# Patient Record
Sex: Female | Born: 1985 | Race: White | Hispanic: No | Marital: Married | Smoking: Former smoker
Health system: Southern US, Community
[De-identification: ages and names within clinical notes are randomized; demographics above are authoritative.]

## PROBLEM LIST (undated history)

## (undated) ENCOUNTER — Inpatient Hospital Stay (HOSPITAL_COMMUNITY): Payer: Self-pay

## (undated) DIAGNOSIS — K219 Gastro-esophageal reflux disease without esophagitis: Secondary | ICD-10-CM

## (undated) DIAGNOSIS — M542 Cervicalgia: Secondary | ICD-10-CM

## (undated) DIAGNOSIS — Z8659 Personal history of other mental and behavioral disorders: Secondary | ICD-10-CM

## (undated) DIAGNOSIS — G43909 Migraine, unspecified, not intractable, without status migrainosus: Secondary | ICD-10-CM

## (undated) DIAGNOSIS — F410 Panic disorder [episodic paroxysmal anxiety] without agoraphobia: Secondary | ICD-10-CM

## (undated) DIAGNOSIS — R220 Localized swelling, mass and lump, head: Secondary | ICD-10-CM

## (undated) DIAGNOSIS — R002 Palpitations: Secondary | ICD-10-CM

## (undated) HISTORY — DX: Palpitations: R00.2

## (undated) HISTORY — DX: Panic disorder (episodic paroxysmal anxiety): F41.0

## (undated) HISTORY — DX: Personal history of other mental and behavioral disorders: Z86.59

## (undated) HISTORY — DX: Cervicalgia: M54.2

## (undated) HISTORY — PX: MOUTH SURGERY: SHX715

## (undated) HISTORY — DX: Localized swelling, mass and lump, head: R22.0

---
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
## 2011-05-11 DIAGNOSIS — G43909 Migraine, unspecified, not intractable, without status migrainosus: Secondary | ICD-10-CM | POA: Insufficient documentation

## 2011-08-11 ENCOUNTER — Other Ambulatory Visit: Payer: Self-pay

## 2011-08-11 ENCOUNTER — Emergency Department (HOSPITAL_COMMUNITY)
Admission: EM | Admit: 2011-08-11 | Discharge: 2011-08-12 | Disposition: A | Discharge status: Home / Self Care | Payer: 59 | Attending: Emergency Medicine | Admitting: Emergency Medicine

## 2011-08-11 ENCOUNTER — Encounter (HOSPITAL_COMMUNITY): Payer: Self-pay | Admitting: *Deleted

## 2011-08-11 ENCOUNTER — Emergency Department (HOSPITAL_COMMUNITY): Payer: 59

## 2011-08-11 DIAGNOSIS — R209 Unspecified disturbances of skin sensation: Secondary | ICD-10-CM | POA: Insufficient documentation

## 2011-08-11 DIAGNOSIS — R221 Localized swelling, mass and lump, neck: Secondary | ICD-10-CM | POA: Insufficient documentation

## 2011-08-11 DIAGNOSIS — R51 Headache: Secondary | ICD-10-CM | POA: Insufficient documentation

## 2011-08-11 DIAGNOSIS — R220 Localized swelling, mass and lump, head: Secondary | ICD-10-CM | POA: Insufficient documentation

## 2011-08-11 DIAGNOSIS — R002 Palpitations: Secondary | ICD-10-CM | POA: Insufficient documentation

## 2011-08-11 DIAGNOSIS — R000 Tachycardia, unspecified: Secondary | ICD-10-CM | POA: Insufficient documentation

## 2011-08-11 MED ORDER — FAMOTIDINE IN NACL 20-0.9 MG/50ML-% IV SOLN
20.0000 mg | Freq: Once | INTRAVENOUS | Status: AC
  Administered 2011-08-11: 20 mg via INTRAVENOUS
  Filled 2011-08-11: qty 50

## 2011-08-11 MED ORDER — SODIUM CHLORIDE 0.9 % IV BOLUS (SEPSIS)
1000.0000 mL | Freq: Once | INTRAVENOUS | Status: AC
  Administered 2011-08-11: 1000 mL via INTRAVENOUS

## 2011-08-11 MED ORDER — DIPHENHYDRAMINE HCL 50 MG/ML IJ SOLN
25.0000 mg | Freq: Once | INTRAMUSCULAR | Status: AC
Start: 2011-08-11 — End: 2011-08-11
  Administered 2011-08-11: 25 mg via INTRAVENOUS
  Filled 2011-08-11: qty 1

## 2011-08-11 MED ORDER — LORAZEPAM 2 MG/ML IJ SOLN
1.0000 mg | Freq: Once | INTRAMUSCULAR | Status: AC
  Administered 2011-08-11: 1 mg via INTRAVENOUS
  Filled 2011-08-11: qty 1

## 2011-08-11 NOTE — ED Notes (Signed)
Pt in c/o swelling to left side of face and neck and pain, denies injury, noted this morning when she woke up, also c/o palpitations and feeling like her heart is racing, states this has happened in the past

## 2011-08-11 NOTE — ED Provider Notes (Signed)
History     CSN: [id number]  Arrival date & time 08/11/11  1859   First MD Initiated Contact with Patient 08/11/11 2008      Chief Complaint  Patient presents with  . Facial Swelling  . 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  No history of cardiac or stroke in the past - states is under a lot of pressure as she is stressed at work and is getting married in two months.  Patient is a 26 y.o. female presenting with palpitations. The history is provided by the patient and the spouse. No language interpreter was used.  Palpitations  This is a new problem. The current episode started 3 to 5 hours ago. The problem occurs constantly. The problem has not changed since onset.The problem is associated with stress. On average, each episode lasts 2 hours. Associated symptoms include numbness and headaches. Pertinent negatives include no diaphoresis, no fever, no malaise/fatigue, no chest pain, no chest pressure, no claudication, no exertional chest pressure, no irregular heartbeat, no near-syncope, no orthopnea, no PND, no syncope, no abdominal pain, no nausea, no vomiting, no back pain, no leg pain, no lower extremity edema, no dizziness, no weakness, no cough, no hemoptysis, no shortness of breath and no sputum production. She has tried nothing for the symptoms. The treatment provided no relief. Risk factors include stress.    History reviewed. No pertinent past medical history.  History reviewed. No  pertinent past surgical history.  History reviewed. No pertinent family history.  History  Substance Use Topics  . Smoking status: Not on file  . Smokeless tobacco: Not on file  . Alcohol Use: Not on file    OB History    Grav Para Term Preterm Abortions TAB SAB Ect Mult Living                  Review of Systems  Constitutional: Negative for fever, malaise/fatigue and diaphoresis.  Respiratory: Negative for cough, hemoptysis, sputum production and shortness of breath.   Cardiovascular: Positive for palpitations. Negative for chest pain, orthopnea, claudication, syncope, PND and near-syncope.  Gastrointestinal: Negative for nausea, vomiting and abdominal pain.  Musculoskeletal: Negative for back pain.  Neurological: Positive for numbness and headaches. Negative for dizziness and weakness.  All other systems reviewed and are negative.    Allergies  Codeine; Prednisone; and Amoxicillin  Home Medications   Current Outpatient Rx  Name Route Sig Dispense Refill  . DEXTROAMPHETAMINE SULFATE ER 15 MG PO CP24 Oral Take 15 mg by mouth daily.    . SUMATRIPTAN SUCCINATE 25 MG PO TABS Oral Take 25 mg by mouth every 2 (two) hours as needed. For symptom relief      BP 121/81  Pulse 84  Temp(Src) 98.2 F (36.8 C) (Oral)  Resp 20  Ht 5\' 2"  (1.575 m)  Wt 138 lb (62.596 kg)  BMI 25.24 kg/m2  SpO2 100%  LMP 07/22/2011  Physical Exam  Nursing note and vitals reviewed. Constitutional: She is oriented to  person, place, and time. She appears well-developed and well-nourished. No distress.  HENT:  Head: Normocephalic and atraumatic.  Right Ear: External ear normal.  Left Ear: External ear normal.  Nose: Nose normal.  Mouth/Throat: Oropharynx is clear and moist. No oropharyngeal exudate.       No oropharyngeal edema noted.  Eyes: Conjunctivae are normal. Pupils are equal, round, and reactive to light. No scleral icterus.  Neck: Normal range of motion. Neck supple.  Cardiovascular:  Regular rhythm and normal heart sounds.        tachycardia  Pulmonary/Chest: Effort normal and breath sounds normal. No respiratory distress. She exhibits no tenderness.  Abdominal: Soft. Bowel sounds are normal. She exhibits no distension. There is no tenderness.  Musculoskeletal: Normal range of motion. She exhibits no edema and no tenderness.  Lymphadenopathy:    She has no cervical adenopathy.  Neurological: She is alert and oriented to person, place, and time. No cranial nerve deficit.  Skin: Skin is warm and dry. No rash noted. No erythema. No pallor.  Psychiatric: She has a normal mood and affect. Her behavior is normal. Judgment and thought content normal.    ED Course  Procedures (including critical care time)  Labs Reviewed - No data to display Ct Head Wo Contrast  08/11/2011  *RADIOLOGY REPORT*  Clinical Data: Left face swelling and numbness.  CT HEAD WITHOUT CONTRAST  Technique:  Contiguous axial images were obtained from the base of the skull through the vertex without contrast.  Comparison: None.  Findings: No evidence for acute hemorrhage, mass lesion, midline shift, hydrocephalus or large infarct.  Normal appearance of the ventricles and basal cisterns.  The white matter in the left cerebral hemisphere is slightly prominent but probably within normal limits.  No acute bony abnormality.  IMPRESSION: Negative head CT.  Original Report Authenticated By: Richarda Overlie, M.D.    Date: 08/11/2011  Rate: 135  Rhythm: sinus tachycardia  QRS Axis: normal  Intervals: normal  ST/T Wave abnormalities: nonspecific T wave changes  Conduction Disutrbances:none  Narrative Interpretation: Reviewed by Dr. Weldon Inches  Old EKG Reviewed: none available   Date: 08/12/2011  Rate: 83  Rhythm: normal sinus rhythm  QRS Axis: normal  Intervals: normal  ST/T Wave abnormalities: normal  Conduction Disutrbances:none  Narrative Interpretation:   Old EKG Reviewed: changes  noted    Palpitations     MDM  Patient's symptoms have improved with ativan and pepcid and benadryl - still has slight headache - will repeat the EKG now that her HR is down to 86.   Patient with palpatiations - likely secondary to dexedrine use - though cannot rule out a cardiac etiology - will refer to outpatient cardiology for this - will also do ativan for the symptoms as well.       Izola Price [place], [place] 08/12/11 [phone]

## 2011-08-12 MED ORDER — LORAZEPAM 1 MG PO TABS: 1.0000 mg | ORAL_TABLET | Freq: Three times a day (TID) | ORAL | Status: AC | PRN

## 2011-08-12 NOTE — ED Provider Notes (Signed)
Medical screening examination/treatment/procedure(s) were performed by non-physician practitioner and as supervising physician I was immediately available for consultation/collaboration.   Glynn Octave, MD 08/12/11 [phone]

## 2011-08-12 NOTE — Discharge Instructions (Signed)
Palpitations  A palpitation is the feeling that your heartbeat is irregular. It may also feel like your heart is beating faster than normal.  HOME CARE To help prevent palpitations:  Do not drink caffeine or alcohol.   Do not eat chocolate.   Do not smoke.   Reduce stress. Try yoga or meditation.   Swim, jogg, or walk.  GET HELP RIGHT AWAY IF:   There is chest pain.   You have shortness of breath.   You deveop a very bad headache.   You develop dizziness or fainting.   You continue to have a fast heartbeat.   The palpitations occur more often.  MAKE SURE YOU:   Understand these instructions.   Will watch your condition.   Will get help right away if you are not doing well or get worse.  Document Released: 03/20/2008 Document Revised: 02/21/2011 Document Reviewed: 03/20/2008 [facility] Patient Information 2012 [place], [place].

## 2011-08-24 ENCOUNTER — Institutional Professional Consult (permissible substitution): Payer: 59 | Admitting: Internal Medicine

## 2011-09-11 ENCOUNTER — Encounter: Payer: Self-pay | Admitting: *Deleted

## 2011-09-11 ENCOUNTER — Ambulatory Visit: Payer: 59 | Admitting: Cardiovascular Disease

## 2011-09-12 ENCOUNTER — Institutional Professional Consult (permissible substitution): Payer: 59 | Admitting: Cardiovascular Disease

## 2012-02-29 ENCOUNTER — Encounter: Payer: Self-pay | Admitting: *Deleted

## 2012-02-29 ENCOUNTER — Encounter: Payer: Self-pay | Admitting: Cardiovascular Disease

## 2012-02-29 DIAGNOSIS — F410 Panic disorder [episodic paroxysmal anxiety] without agoraphobia: Secondary | ICD-10-CM | POA: Insufficient documentation

## 2012-02-29 DIAGNOSIS — M542 Cervicalgia: Secondary | ICD-10-CM | POA: Insufficient documentation

## 2012-02-29 DIAGNOSIS — R220 Localized swelling, mass and lump, head: Secondary | ICD-10-CM | POA: Insufficient documentation

## 2012-02-29 DIAGNOSIS — R002 Palpitations: Secondary | ICD-10-CM | POA: Insufficient documentation

## 2012-03-03 ENCOUNTER — Encounter: Payer: Self-pay | Admitting: Cardiovascular Disease

## 2012-03-03 ENCOUNTER — Ambulatory Visit (INDEPENDENT_AMBULATORY_CARE_PROVIDER_SITE_OTHER): Payer: 59 | Admitting: Cardiovascular Disease

## 2012-03-03 VITALS — BP 112/70 | HR 72 | Ht 65.0 in | Wt 146.0 lb

## 2012-03-03 DIAGNOSIS — F410 Panic disorder [episodic paroxysmal anxiety] without agoraphobia: Secondary | ICD-10-CM

## 2012-03-03 DIAGNOSIS — R079 Chest pain, unspecified: Secondary | ICD-10-CM

## 2012-03-03 DIAGNOSIS — R002 Palpitations: Secondary | ICD-10-CM

## 2012-03-03 NOTE — Progress Notes (Signed)
Patient ID: Darlene Henderson, female   DOB: [birth year], 26 y.o.   MRN: [id number] 26 you referred by ER  Seen 2/13  Palpitations.  Was on dexidrine at time for ADD.  Has had this for a long time on concerta and ritalin in past.  Palpitations sound benign Not related to exertion.  Last minutes.  No associated dyspnea or chest pain.  No inciting or allieviating factors.  Rapid irregular beats.  No history of congenital heart disease.  She is currently [redacted] weeks pregnant and walking actively with no issues.    ROS: Denies fever, malais, weight loss, blurry vision, decreased visual acuity, cough, sputum, SOB, hemoptysis, pleuritic pain, palpitaitons, heartburn, abdominal pain, melena, lower extremity edema, claudication, or rash.  All other systems reviewed and negative   General: Affect appropriate Healthy:  appears stated age HEENT: normal Neck supple with no adenopathy JVP normal no bruits no thyromegaly Lungs clear with no wheezing and good diaphragmatic motion Heart:  S1/S2 no murmur,rub, gallop or click PMI normal Abdomen: benighn, BS positve, no tenderness, no AAA no bruit.  No HSM or HJR Distal pulses intact with no bruits No edema Neuro non-focal Skin warm and dry No muscular weakness  Medications Current Outpatient Prescriptions  Medication Sig Dispense Refill  . omeprazole (PRILOSEC) 40 MG capsule         Allergies Codeine; Prednisone; and Amoxicillin  Family History: No family history on file.  Social History: History   Social History  . Marital Status: Single    Spouse Name: N/A    Number of Children: N/A  . Years of Education: N/A   Occupational History  . Not on file.   Social History Main Topics  . Smoking status: Former Smoker -- 1.0 packs/day for 7 years    Types: Cigarettes    Quit date: 01/24/2012  . Smokeless tobacco: Not on file  . Alcohol Use: Not on file  . Drug Use: Not on file  . Sexually Active: Not on file   Other Topics Concern  . Not on file    Social History Narrative  . No narrative on file    Electrocardiogram:  2/13  NSR normal ECG Rate 135 sinus tachycardia  Assessment and Plan

## 2012-03-03 NOTE — Patient Instructions (Signed)
Your physician recommends that you schedule a follow-up appointment in: 3 MONTH WITH DR [facility] Your physician recommends that you continue on your current medications as directed. Please refer to the Current Medication list given to you today. Your physician has recommended that you wear an event monitor. Event monitors are medical devices that record the heart's electrical activity. Doctors most often [place] these monitors to diagnose arrhythmias. Arrhythmias are problems with the speed or rhythm of the heartbeat. The monitor is a small, portable device. You can wear one while you do your normal daily activities. This is usually used to diagnose what is causing palpitations/syncope (passing out). DX 785.1 Your physician has requested that you have a stress echocardiogram. For further information please visit [email or web address] Please follow instruction sheet as given. DX CHEST PAIN

## 2012-03-03 NOTE — Assessment & Plan Note (Signed)
Likely related to symtoms  Given pregnancy would not use beta blockers

## 2012-03-03 NOTE — Assessment & Plan Note (Signed)
Benign F/U stress echo.  Avoid dexadrine and other stimulants.  Limit caffiene

## 2012-03-06 ENCOUNTER — Encounter (INDEPENDENT_AMBULATORY_CARE_PROVIDER_SITE_OTHER): Payer: 59

## 2012-03-06 DIAGNOSIS — R002 Palpitations: Secondary | ICD-10-CM

## 2012-03-10 ENCOUNTER — Other Ambulatory Visit (HOSPITAL_COMMUNITY): Payer: 59

## 2012-03-19 ENCOUNTER — Other Ambulatory Visit (HOSPITAL_COMMUNITY): Payer: 59

## 2012-04-10 LAB — OB RESULTS CONSOLE HIV ANTIBODY (ROUTINE TESTING): HIV: NONREACTIVE

## 2012-04-10 LAB — OB RESULTS CONSOLE RPR: RPR: NONREACTIVE

## 2012-04-10 LAB — OB RESULTS CONSOLE RUBELLA ANTIBODY, IGM: Rubella: IMMUNE

## 2012-04-10 LAB — OB RESULTS CONSOLE GC/CHLAMYDIA: Chlamydia: NEGATIVE

## 2012-04-10 LAB — OB RESULTS CONSOLE HEPATITIS B SURFACE ANTIGEN: Hepatitis B Surface Ag: NEGATIVE

## 2012-04-17 ENCOUNTER — Telehealth: Payer: Self-pay | Admitting: *Deleted

## 2012-04-17 NOTE — Telephone Encounter (Signed)
PT AWARE OF MONITOR RESULTS PER DR NISHAN  SR NO SIG PROBLEMS .Darlene Henderson

## 2012-06-13 ENCOUNTER — Ambulatory Visit: Payer: 59 | Admitting: Cardiovascular Disease

## 2012-06-25 HISTORY — PX: OTHER SURGICAL HISTORY: SHX169

## 2012-06-25 NOTE — L&D Delivery Note (Signed)
Patient was C/C/+1 and pushed for 140 minutes with epidural.   NSVD female infant, Apgars 8,9, weight P.   The patient had one midline second degree episiotomy repaired with 2-0 vicryl R. Fundus was firm. EBL was expected. Placenta was delivered intact. Vagina was clear.  Baby was vigorous to bedside.  Darlene Henderson

## 2012-08-14 ENCOUNTER — Inpatient Hospital Stay (HOSPITAL_COMMUNITY)
Admission: AD | Admit: 2012-08-14 | Discharge: 2012-08-14 | Disposition: A | Discharge status: Home / Self Care | Payer: 59 | Source: Ambulatory Visit | Attending: Obstetrics and Gynecology | Admitting: Obstetrics and Gynecology

## 2012-08-14 ENCOUNTER — Encounter (HOSPITAL_COMMUNITY): Payer: Self-pay | Admitting: *Deleted

## 2012-08-14 DIAGNOSIS — N930 Postcoital and contact bleeding: Secondary | ICD-10-CM

## 2012-08-14 DIAGNOSIS — O4690 Antepartum hemorrhage, unspecified, unspecified trimester: Secondary | ICD-10-CM | POA: Insufficient documentation

## 2012-08-14 NOTE — MAU Note (Signed)
Pt stated she noticed dark red blood in her underwear and when she wiped around 2pm. Denies pain or discomfort at this time a reporte good fetal movment.

## 2012-08-14 NOTE — MAU Note (Signed)
Pt states she noticed vaginal bleeding about 1350. Pt states she had 2 episodes of bleeding after voiding. Pt states she did not see bleeding when she went to the bathroom this time

## 2012-08-14 NOTE — MAU Provider Note (Signed)
Chief Complaint:  Vaginal Bleeding   First Provider Initiated Contact with Patient 08/14/12 1629      HPI: Darlene Henderson is a 27 y.o. G1P0 at [redacted]w[redacted]d who presents to maternity admissions reporting red spotting noted on toilet paper after voiding 2 hr PTA, not with last void.  Denies contractions, leakage of fluid. Had a spotting episode in early pregnancy thought to be post-coital. Good fetal movement.   Pregnancy Course:  Uncomplicated with normal USs  Past Medical History: Past Medical History  Diagnosis Date  . Palpitations   . Facial swelling   . Panic attack   . Neck pain   . Chest pain   . History of ADHD   . Migraine     Past obstetric history: OB History   Grav Para Term Preterm Abortions TAB SAB Ect Mult Living   1              # Outc Date GA Lbr Len/2nd Wgt Sex Del Anes PTL Lv   1 CUR               Past Surgical History: Past Surgical History  Procedure Laterality Date  . No past surgeries    . Mouth surgery      Family History: No family history on file.  Social History: History  Substance Use Topics  . Smoking status: Former Smoker -- 1.00 packs/day for 7 years    Types: Cigarettes    Quit date: 01/24/2012  . Smokeless tobacco: Not on file  . Alcohol Use: Not on file    Allergies:  Allergies  Allergen Reactions  . Codeine Nausea Only and Other (See Comments)    Causes sweating and dry heaving.  . Prednisone Nausea And Vomiting  . Amoxicillin Nausea Only    Meds:  Prescriptions prior to admission  Medication Sig Dispense Refill  . omeprazole (PRILOSEC) 40 MG capsule Take 40 mg by mouth daily.       . Prenatal Vit-Fe Fumarate-FA (PRENATAL MULTIVITAMIN) TABS Take 1 tablet by mouth daily.        ROS: Pertinent findings in history of present illness.  Physical Exam  Blood pressure 120/68, pulse 92, temperature 98.4 F (36.9 C), temperature source Oral, resp. rate 18, height 5\' 5"  (1.651 m), weight 82.283 kg (181 lb 6.4 oz). GENERAL:  Well-developed, well-nourished female in no acute distress.  HEENT: normocephalic HEART: normal rate RESP: normal effort ABDOMEN: Soft, non-tender, gravid appropriate for gestational age EXTREMITIES: Nontender, no edema NEURO: alert and oriented SPECULUM EXAM: NEFG,watery discharge discharge, no blood, cervix clean SVE: post, soft, closed, thick/high 1 cm spot of dried blood on underwear  FHT:  Baseline 145 , moderate variability, accelerations present, no decelerations Contractions: isolated mild   Labs: No results found for this or any previous visit (from the past 24 hour(s)).  Imaging:  No results found. MAU Course: Fern neg  Assessment: 1. PCB (post coital bleeding)   G1 at [redacted]w[redacted]d  Plan: C/W Dr. Ladon Applebaum home Pelvic rest until nezxt PN visit Labor precautions and fetal kick counts    Medication List    TAKE these medications       omeprazole 40 MG capsule  Commonly known as:  PRILOSEC  Take 40 mg by mouth daily.     prenatal multivitamin Tabs  Take 1 tablet by mouth daily.       Follow-up Information   Follow up with CALLAHAN, SIDNEY, DO. (Keep regular apppointment)    Contact information:  [address] [place] [address] [phone]       Danae Orleans, CNM 08/14/2012 4:58 PM

## 2012-08-26 ENCOUNTER — Inpatient Hospital Stay (HOSPITAL_COMMUNITY)
Admission: AD | Admit: 2012-08-26 | Discharge: 2012-08-26 | Disposition: A | Discharge status: Home / Self Care | Payer: 59 | Source: Ambulatory Visit | Attending: Obstetrics and Gynecology | Admitting: Obstetrics and Gynecology

## 2012-08-26 ENCOUNTER — Encounter (HOSPITAL_COMMUNITY): Payer: Self-pay | Admitting: *Deleted

## 2012-08-26 DIAGNOSIS — O99891 Other specified diseases and conditions complicating pregnancy: Secondary | ICD-10-CM | POA: Insufficient documentation

## 2012-08-26 DIAGNOSIS — R109 Unspecified abdominal pain: Secondary | ICD-10-CM | POA: Insufficient documentation

## 2012-08-26 LAB — URINALYSIS, ROUTINE W REFLEX MICROSCOPIC
Glucose, UA: NEGATIVE mg/dL
Hgb urine dipstick: NEGATIVE
Ketones, ur: NEGATIVE mg/dL
Leukocytes, UA: NEGATIVE
Protein, ur: NEGATIVE mg/dL

## 2012-08-26 MED ORDER — ACETAMINOPHEN 500 MG PO TABS
1000.0000 mg | ORAL_TABLET | Freq: Once | ORAL | Status: AC
  Administered 2012-08-26: 1000 mg via ORAL
  Filled 2012-08-26: qty 2

## 2012-08-26 NOTE — MAU Provider Note (Signed)
History     CSN: [id number]  Arrival date and time: 08/26/12 1238   First Katti Pelle Initiated Contact with Patient 08/26/12 1332      Chief Complaint  Patient presents with  . Abdominal Pain   HPI Pt is [redacted]w[redacted]d pregnant and presents with abdominal pain that has been constant today since she awakened.  Pt noticed the pain yesterday.  Pain is located on her right side more than left.  Pt denies spotting or leakage of fluid, constipation or diarrhea.  Pt denies any spotting or bleeding or leakage of fluid  Past Medical History  Diagnosis Date  . Palpitations   . Facial swelling   . Panic attack   . Neck pain   . Chest pain   . History of ADHD   . Migraine     Past Surgical History  Procedure Laterality Date  . No past surgeries    . Mouth surgery      Family History  Problem Relation Age of Onset  . Cancer Mother   . Heart disease Paternal Grandmother     History  Substance Use Topics  . Smoking status: Former Smoker -- 1.00 packs/day for 7 years    Types: Cigarettes    Quit date: 01/24/2012  . Smokeless tobacco: Not on file  . Alcohol Use: No    Allergies:  Allergies  Allergen Reactions  . Codeine Nausea Only and Other (See Comments)    Causes sweating and dry heaving.  . Prednisone Nausea And Vomiting  . Amoxicillin Nausea Only    Prescriptions prior to admission  Medication Sig Dispense Refill  . omeprazole (PRILOSEC) 40 MG capsule Take 40 mg by mouth daily.       . Prenatal Vit-Fe Fumarate-FA (PRENATAL MULTIVITAMIN) TABS Take 1 tablet by mouth daily.        Review of Systems  Constitutional: Negative for fever and chills.  Gastrointestinal: Positive for abdominal pain. Negative for nausea, vomiting, diarrhea and constipation.  Genitourinary: Negative for dysuria and urgency.   Physical Exam   Blood pressure 127/71, pulse 90, resp. rate 16, height 5\' 5"  (1.651 m), weight 183 lb (83.008 kg).  Physical Exam  Nursing note and vitals  reviewed. Constitutional: She is oriented to person, place, and time. She appears well-developed and well-nourished. No distress.  HENT:  Head: Normocephalic.  Eyes: Pupils are equal, round, and reactive to light.  Neck: Normal range of motion. Neck supple.  Cardiovascular: Normal rate.   Respiratory: Effort normal.  GI: Soft. She exhibits no distension. There is no rebound and no guarding.  Tender with palpation right mid-upper uterus- no rebound  Genitourinary:  Cervix long and closed NT vaginal clean with small- mod amount of creamy white discharge in vault  Musculoskeletal: Normal range of motion.  Neurological: She is alert and oriented to person, place, and time.  Skin: Skin is warm and dry.  Psychiatric: She has a normal mood and affect.    MAU Course  Procedures Results for orders placed during the hospital encounter of 08/26/12 (from the past 24 hour(s))  URINALYSIS, ROUTINE W REFLEX MICROSCOPIC     Status: Abnormal   Collection Time    08/26/12 12:55 PM      Result Value Range   Color, Urine YELLOW  YELLOW   APPearance CLEAR  CLEAR   Specific Gravity, Urine <1.005 (*) 1.005 - 1.030   pH 7.0  5.0 - 8.0   Glucose, UA NEGATIVE  NEGATIVE mg/dL   Hgb urine  dipstick NEGATIVE  NEGATIVE   Bilirubin Urine NEGATIVE  NEGATIVE   Ketones, ur NEGATIVE  NEGATIVE mg/dL   Protein, ur NEGATIVE  NEGATIVE mg/dL   Urobilinogen, UA 0.2  0.0 - 1.0 mg/dL   Nitrite NEGATIVE  NEGATIVE   Leukocytes, UA NEGATIVE  NEGATIVE  pt monitored for 1 hour without any contractions noted; FHR reassuring for gestational age Tylenol given for discomfort Discussed with Dr. Henderson Cloud- pt may go home   Assessment and Plan  Abdominal pain in pregnancy Information given on round ligament pain and abd pain in pregnancy F/u with Dr. Henderson Cloud- report any increase in symptoms  LINEBERRY,SUSAN 08/26/2012, 1:33 PM

## 2012-08-26 NOTE — MAU Note (Signed)
C/o cramping since 0650 this AM;

## 2012-08-26 NOTE — MAU Note (Signed)
Denies any injury or strain; c/o constant cramping over entire abdomen; husband with pt; denies any bleeding;

## 2012-09-22 ENCOUNTER — Other Ambulatory Visit: Payer: Self-pay | Admitting: Obstetrics and Gynecology

## 2012-09-24 ENCOUNTER — Inpatient Hospital Stay (HOSPITAL_COMMUNITY)
Admission: AD | Admit: 2012-09-24 | Discharge: 2012-09-24 | Disposition: A | Discharge status: Home / Self Care | Payer: 59 | Source: Ambulatory Visit | Attending: Obstetrics and Gynecology | Admitting: Obstetrics and Gynecology

## 2012-09-24 DIAGNOSIS — O4700 False labor before 37 completed weeks of gestation, unspecified trimester: Secondary | ICD-10-CM

## 2012-09-24 DIAGNOSIS — R109 Unspecified abdominal pain: Secondary | ICD-10-CM | POA: Insufficient documentation

## 2012-09-24 DIAGNOSIS — O200 Threatened abortion: Secondary | ICD-10-CM | POA: Insufficient documentation

## 2012-09-24 LAB — URINALYSIS, ROUTINE W REFLEX MICROSCOPIC
Bilirubin Urine: NEGATIVE
Glucose, UA: 250 mg/dL — AB
Hgb urine dipstick: NEGATIVE
Ketones, ur: NEGATIVE mg/dL
pH: 6.5 (ref 5.0–8.0)

## 2012-09-24 NOTE — Progress Notes (Signed)
Dr Dareen Piano notified of patient. Tracing, ctx pattern. Order to check cervix if irregular contractions and spaced contraction. Discharge home

## 2012-09-24 NOTE — MAU Note (Signed)
Patient is in with c/o intermittent contractions all day. She denies any vaginal bleeding or lof.she reports good fetal movement.

## 2012-09-24 NOTE — MAU Provider Note (Signed)
S: 27 y.o. G1P0 @ [redacted]w[redacted]d presents to MAU with episode of cramping/contractions this morning, with continued irregular cramping today.  She reports standing on her feet at work and this is when cramping is worse. She reports good fetal movement, denies LOF, vaginal bleeding, vaginal itching/burning, urinary symptoms, h/a, dizziness, n/v, or fever/chills.    O: Cervix 1/40/-3, posterior, firm, vertex, unchanged from exam in office this week  FHR 135 with moderate variability, positive accels, negative decels Contractions rare on toco, mild to palpation A: 1. Threatened preterm labor, antepartum, third trimester    P: RN spoke with Dr Dareen Piano  D/C home Letter for pt to be out of work next 2 days Drink plenty of fluids F/U with prenatal provider Return to MAU as needed  Sharen Counter Certified Nurse-Midwife

## 2012-10-22 ENCOUNTER — Inpatient Hospital Stay (HOSPITAL_COMMUNITY): Payer: 59 | Admitting: Anesthesiology

## 2012-10-22 ENCOUNTER — Encounter (HOSPITAL_COMMUNITY): Payer: Self-pay | Admitting: Anesthesiology

## 2012-10-22 ENCOUNTER — Encounter (HOSPITAL_COMMUNITY): Payer: Self-pay

## 2012-10-22 ENCOUNTER — Inpatient Hospital Stay (HOSPITAL_COMMUNITY)
Admission: AD | Admit: 2012-10-22 | Discharge: 2012-10-24 | DRG: 775 | Disposition: A | Discharge status: Home / Self Care | Payer: 59 | Source: Ambulatory Visit | Attending: Obstetrics & Gynecology | Admitting: Obstetrics & Gynecology

## 2012-10-22 DIAGNOSIS — R220 Localized swelling, mass and lump, head: Secondary | ICD-10-CM

## 2012-10-22 DIAGNOSIS — M542 Cervicalgia: Secondary | ICD-10-CM

## 2012-10-22 DIAGNOSIS — F410 Panic disorder [episodic paroxysmal anxiety] without agoraphobia: Secondary | ICD-10-CM

## 2012-10-22 DIAGNOSIS — R002 Palpitations: Secondary | ICD-10-CM

## 2012-10-22 DIAGNOSIS — Z8659 Personal history of other mental and behavioral disorders: Secondary | ICD-10-CM

## 2012-10-22 HISTORY — DX: Gastro-esophageal reflux disease without esophagitis: K21.9

## 2012-10-22 LAB — CBC
MCH: 29.8 pg (ref 26.0–34.0)
Platelets: 193 10*3/uL (ref 150–400)
RBC: 4.09 MIL/uL (ref 3.87–5.11)
RDW: 14.2 % (ref 11.5–15.5)
WBC: 10.4 10*3/uL (ref 4.0–10.5)

## 2012-10-22 LAB — RPR: RPR Ser Ql: NONREACTIVE

## 2012-10-22 LAB — TYPE AND SCREEN

## 2012-10-22 MED ORDER — ZOLPIDEM TARTRATE 5 MG PO TABS: 5.0000 mg | ORAL_TABLET | Freq: Every evening | ORAL | Status: DC | PRN

## 2012-10-22 MED ORDER — BENZOCAINE-MENTHOL 20-0.5 % EX AERO
1.0000 "application " | INHALATION_SPRAY | CUTANEOUS | Status: DC | PRN
  Administered 2012-10-24: 1 via TOPICAL
  Filled 2012-10-22: qty 56

## 2012-10-22 MED ORDER — EPHEDRINE 5 MG/ML INJ
10.0000 mg | INTRAVENOUS | Status: DC | PRN
  Filled 2012-10-22: qty 4
  Filled 2012-10-22: qty 2

## 2012-10-22 MED ORDER — OXYCODONE-ACETAMINOPHEN 5-325 MG PO TABS: 1.0000 | ORAL_TABLET | ORAL | Status: DC | PRN

## 2012-10-22 MED ORDER — ONDANSETRON HCL 4 MG PO TABS: 4.0000 mg | ORAL_TABLET | ORAL | Status: DC | PRN

## 2012-10-22 MED ORDER — EPHEDRINE 5 MG/ML INJ
10.0000 mg | INTRAVENOUS | Status: DC | PRN
  Filled 2012-10-22: qty 2

## 2012-10-22 MED ORDER — LACTATED RINGERS IV SOLN
500.0000 mL | Freq: Once | INTRAVENOUS | Status: AC
  Administered 2012-10-22: 1000 mL via INTRAVENOUS

## 2012-10-22 MED ORDER — WITCH HAZEL-GLYCERIN EX PADS
1.0000 "application " | MEDICATED_PAD | CUTANEOUS | Status: DC | PRN
  Administered 2012-10-24: 1 via TOPICAL

## 2012-10-22 MED ORDER — TERBUTALINE SULFATE 1 MG/ML IJ SOLN: 0.2500 mg | Freq: Once | INTRAMUSCULAR | Status: DC | PRN

## 2012-10-22 MED ORDER — SODIUM CHLORIDE 0.9 % IV SOLN: 250.0000 mL | INTRAVENOUS | Status: DC | PRN

## 2012-10-22 MED ORDER — PHENYLEPHRINE 40 MCG/ML (10ML) SYRINGE FOR IV PUSH (FOR BLOOD PRESSURE SUPPORT)
80.0000 ug | PREFILLED_SYRINGE | INTRAVENOUS | Status: DC | PRN
  Filled 2012-10-22: qty 5
  Filled 2012-10-22: qty 2

## 2012-10-22 MED ORDER — DIPHENHYDRAMINE HCL 25 MG PO CAPS: 25.0000 mg | ORAL_CAPSULE | Freq: Four times a day (QID) | ORAL | Status: DC | PRN

## 2012-10-22 MED ORDER — OXYTOCIN 40 UNITS IN LACTATED RINGERS INFUSION - SIMPLE MED: 62.5000 mL/h | INTRAVENOUS | Status: DC

## 2012-10-22 MED ORDER — LACTATED RINGERS IV SOLN
INTRAVENOUS | Status: DC
  Administered 2012-10-22 (×2): via INTRAVENOUS

## 2012-10-22 MED ORDER — PHENYLEPHRINE 40 MCG/ML (10ML) SYRINGE FOR IV PUSH (FOR BLOOD PRESSURE SUPPORT)
80.0000 ug | PREFILLED_SYRINGE | INTRAVENOUS | Status: DC | PRN
  Filled 2012-10-22: qty 2

## 2012-10-22 MED ORDER — TETANUS-DIPHTH-ACELL PERTUSSIS 5-2.5-18.5 LF-MCG/0.5 IM SUSP: 0.5000 mL | Freq: Once | INTRAMUSCULAR | Status: DC

## 2012-10-22 MED ORDER — MAGNESIUM HYDROXIDE 400 MG/5ML PO SUSP: 30.0000 mL | ORAL | Status: DC | PRN

## 2012-10-22 MED ORDER — OXYTOCIN BOLUS FROM INFUSION: 500.0000 mL | INTRAVENOUS | Status: DC

## 2012-10-22 MED ORDER — BUTORPHANOL TARTRATE 1 MG/ML IJ SOLN: 1.0000 mg | INTRAMUSCULAR | Status: DC | PRN

## 2012-10-22 MED ORDER — FLEET ENEMA 7-19 GM/118ML RE ENEM: 1.0000 | ENEMA | RECTAL | Status: DC | PRN

## 2012-10-22 MED ORDER — DIBUCAINE 1 % RE OINT
1.0000 "application " | TOPICAL_OINTMENT | RECTAL | Status: DC | PRN
  Administered 2012-10-24: 1 via RECTAL
  Filled 2012-10-22: qty 28

## 2012-10-22 MED ORDER — FENTANYL 2.5 MCG/ML BUPIVACAINE 1/10 % EPIDURAL INFUSION (WH - ANES)
14.0000 mL/h | INTRAMUSCULAR | Status: DC | PRN
  Administered 2012-10-22: 14 mL/h via EPIDURAL
  Filled 2012-10-22 (×2): qty 125

## 2012-10-22 MED ORDER — METHYLERGONOVINE MALEATE 0.2 MG PO TABS: 0.2000 mg | ORAL_TABLET | ORAL | Status: DC | PRN

## 2012-10-22 MED ORDER — ONDANSETRON HCL 4 MG/2ML IJ SOLN: 4.0000 mg | Freq: Four times a day (QID) | INTRAMUSCULAR | Status: DC | PRN

## 2012-10-22 MED ORDER — LIDOCAINE HCL (PF) 1 % IJ SOLN
30.0000 mL | INTRAMUSCULAR | Status: DC | PRN
  Filled 2012-10-22 (×2): qty 30

## 2012-10-22 MED ORDER — METHYLERGONOVINE MALEATE 0.2 MG/ML IJ SOLN: 0.2000 mg | INTRAMUSCULAR | Status: DC | PRN

## 2012-10-22 MED ORDER — DIPHENHYDRAMINE HCL 50 MG/ML IJ SOLN: 12.5000 mg | INTRAMUSCULAR | Status: DC | PRN

## 2012-10-22 MED ORDER — CITRIC ACID-SODIUM CITRATE 334-500 MG/5ML PO SOLN: 30.0000 mL | ORAL | Status: DC | PRN

## 2012-10-22 MED ORDER — SODIUM CHLORIDE 0.9 % IJ SOLN: 3.0000 mL | Freq: Two times a day (BID) | INTRAMUSCULAR | Status: DC

## 2012-10-22 MED ORDER — ACETAMINOPHEN 325 MG PO TABS: 650.0000 mg | ORAL_TABLET | ORAL | Status: DC | PRN

## 2012-10-22 MED ORDER — SIMETHICONE 80 MG PO CHEW: 80.0000 mg | CHEWABLE_TABLET | ORAL | Status: DC | PRN

## 2012-10-22 MED ORDER — FENTANYL 2.5 MCG/ML BUPIVACAINE 1/10 % EPIDURAL INFUSION (WH - ANES)
INTRAMUSCULAR | Status: DC | PRN
  Administered 2012-10-22: 14 mL/h via EPIDURAL

## 2012-10-22 MED ORDER — PRENATAL MULTIVITAMIN CH
1.0000 | ORAL_TABLET | Freq: Every day | ORAL | Status: DC
  Administered 2012-10-23: 1 via ORAL

## 2012-10-22 MED ORDER — LANOLIN HYDROUS EX OINT: TOPICAL_OINTMENT | CUTANEOUS | Status: DC | PRN

## 2012-10-22 MED ORDER — FERROUS SULFATE 325 (65 FE) MG PO TABS
325.0000 mg | ORAL_TABLET | Freq: Two times a day (BID) | ORAL | Status: DC
  Administered 2012-10-22 – 2012-10-24 (×4): 325 mg via ORAL
  Filled 2012-10-22 (×4): qty 1

## 2012-10-22 MED ORDER — IBUPROFEN 800 MG PO TABS
800.0000 mg | ORAL_TABLET | Freq: Three times a day (TID) | ORAL | Status: DC
  Administered 2012-10-22 – 2012-10-24 (×6): 800 mg via ORAL
  Filled 2012-10-22 (×8): qty 1

## 2012-10-22 MED ORDER — OXYTOCIN 40 UNITS IN LACTATED RINGERS INFUSION - SIMPLE MED
1.0000 m[IU]/min | INTRAVENOUS | Status: DC
  Filled 2012-10-22: qty 1000

## 2012-10-22 MED ORDER — ONDANSETRON HCL 4 MG/2ML IJ SOLN: 4.0000 mg | INTRAMUSCULAR | Status: DC | PRN

## 2012-10-22 MED ORDER — SODIUM CHLORIDE 0.9 % IJ SOLN: 3.0000 mL | INTRAMUSCULAR | Status: DC | PRN

## 2012-10-22 MED ORDER — MEASLES, MUMPS & RUBELLA VAC ~~LOC~~ INJ: 0.5000 mL | INJECTION | Freq: Once | SUBCUTANEOUS | Status: DC

## 2012-10-22 MED ORDER — LIDOCAINE HCL (PF) 1 % IJ SOLN
INTRAMUSCULAR | Status: DC | PRN
  Administered 2012-10-22 (×2): 4 mL

## 2012-10-22 MED ORDER — LACTATED RINGERS IV SOLN: 500.0000 mL | INTRAVENOUS | Status: DC | PRN

## 2012-10-22 MED ORDER — IBUPROFEN 600 MG PO TABS: 600.0000 mg | ORAL_TABLET | Freq: Four times a day (QID) | ORAL | Status: DC | PRN

## 2012-10-22 MED ORDER — SENNOSIDES-DOCUSATE SODIUM 8.6-50 MG PO TABS
2.0000 | ORAL_TABLET | Freq: Every day | ORAL | Status: DC
  Administered 2012-10-22 – 2012-10-23 (×2): 2 via ORAL

## 2012-10-22 NOTE — Anesthesia Preprocedure Evaluation (Signed)
Anesthesia Evaluation  Patient identified by MRN, date of birth, ID band Patient awake    Reviewed: Allergy & Precautions, H&P , Patient's Chart, lab work & pertinent test results  Airway Mallampati: III TM Distance: >3 FB Neck ROM: full    Dental no notable dental hx. (+) Teeth Intact   Pulmonary former smoker,  breath sounds clear to auscultation  Pulmonary exam normal       Cardiovascular negative cardio ROS  Rhythm:regular Rate:Normal     Neuro/Psych  Headaches, ADHDnegative neurological ROS     GI/Hepatic Neg liver ROS, GERD-  Medicated and Controlled,  Endo/Other  Obesity  Renal/GU negative Renal ROS  negative genitourinary   Musculoskeletal   Abdominal   Peds  Hematology negative hematology ROS (+)   Anesthesia Other Findings   Reproductive/Obstetrics (+) Pregnancy                           Anesthesia Physical Anesthesia Plan  ASA: II  Anesthesia Plan: Epidural   Post-op Pain Management:    Induction:   Airway Management Planned:   Additional Equipment:   Intra-op Plan:   Post-operative Plan:   Informed Consent: I have reviewed the patients History and Physical, chart, labs and discussed the procedure including the risks, benefits and alternatives for the proposed anesthesia with the patient or authorized representative who has indicated his/her understanding and acceptance.     Plan Discussed with: Anesthesiologist  Anesthesia Plan Comments:         Anesthesia Quick Evaluation

## 2012-10-22 NOTE — MAU Note (Signed)
Pt states water broke at 2am. States it was clear fluid. States mild contractions. Denies vaginal bleeding.

## 2012-10-22 NOTE — H&P (Signed)
27 y.o. G1P0  Estimated Date of Delivery: 10/27/12 admitted at 39/[redacted] weeks gestation with SROM.  Prenatal Transfer Tool  Maternal Diabetes: No Genetic Screening: Normal Maternal Ultrasounds/Referrals: Normal Fetal Ultrasounds or other Referrals:  None Maternal Substance Abuse:  No Significant Maternal Medications:  None Significant Maternal Lab Results: None Other Significant Pregnancy Complications:  None  Afebrile, VSS Heart and Lungs: No active disease Abdomen: soft, gravid, EFW AGA. Cervical exam:  2.5/60  Impression: Term preg.  SROM  Plan:  Admit, IV pitocin

## 2012-10-22 NOTE — Anesthesia Procedure Notes (Signed)
Epidural Patient location during procedure: OB Start time: 10/22/2012 4:40 AM  Staffing Anesthesiologist: Jarrod Bodkins A. Performed by: anesthesiologist   Preanesthetic Checklist Completed: patient identified, site marked, surgical consent, pre-op evaluation, timeout performed, IV checked, risks and benefits discussed and monitors and equipment checked  Epidural Patient position: sitting Prep: site prepped and draped and DuraPrep Patient monitoring: continuous pulse ox and blood pressure Approach: midline Injection technique: LOR air  Needle:  Needle type: Tuohy  Needle gauge: 17 G Needle length: 9 cm and 9 Needle insertion depth: 6 cm Catheter type: closed end flexible Catheter size: 19 Gauge Catheter at skin depth: 11 cm Test dose: negative and Other  Assessment Events: blood not aspirated, injection not painful, no injection resistance, negative IV test and no paresthesia  Additional Notes Patient identified. Risks and benefits discussed including failed block, incomplete  Pain control, post dural puncture headache, nerve damage, paralysis, blood pressure Changes, nausea, vomiting, reactions to medications-both toxic and allergic and post Partum back pain. All questions were answered. Patient expressed understanding and wished to proceed. Sterile technique was used throughout procedure. Epidural site was Dressed with sterile barrier dressing. No paresthesias, signs of intravascular injection Or signs of intrathecal spread were encountered.  Patient was more comfortable after the epidural was dosed. Please see RN's note for documentation of vital signs and FHR which are stable.

## 2012-10-22 NOTE — Progress Notes (Signed)
Pt comfortable.  Filed Vitals:   10/22/12 0601 10/22/12 0631 10/22/12 0701 10/22/12 0731  BP: 134/76 138/73 131/68 143/85  Pulse: 75 91 78 84  Temp:    98.1 F (36.7 C)  TempSrc:    Oral  Resp:    18  Height:      Weight:      SpO2:       FHTs 130s, gstv, NST R; occasional early decels Toco q 3  SVE 4/60/-1  A/P continue care. Morse Brueggemann A

## 2012-10-23 ENCOUNTER — Encounter (HOSPITAL_COMMUNITY): Payer: Self-pay | Admitting: *Deleted

## 2012-10-23 LAB — CBC
HCT: 31.7 % — ABNORMAL LOW (ref 36.0–46.0)
MCH: 29.7 pg (ref 26.0–34.0)
MCV: 90.6 fL (ref 78.0–100.0)
RBC: 3.5 MIL/uL — ABNORMAL LOW (ref 3.87–5.11)
WBC: 12.7 10*3/uL — ABNORMAL HIGH (ref 4.0–10.5)

## 2012-10-23 NOTE — Anesthesia Postprocedure Evaluation (Signed)
  Anesthesia Post-op Note  Patient: Darlene Henderson  Procedure(s) Performed: * No procedures listed *  Patient Location: Mother/Baby  Anesthesia Type:Epidural  Level of Consciousness: awake, alert , oriented and patient cooperative  Airway and Oxygen Therapy: Patient Spontanous Breathing  Post-op Pain: mild  Post-op Assessment: Patient's Cardiovascular Status Stable, Respiratory Function Stable, No headache, No backache, No residual numbness and No residual motor weakness  Post-op Vital Signs: stable  Complications: No apparent anesthesia complications

## 2012-10-23 NOTE — Anesthesia Postprocedure Evaluation (Signed)
  Anesthesia Post-op Note  Patient: Darlene Henderson  Procedure(s) Performed: * No procedures listed *  Patient Location: Mother/Baby  Anesthesia Type:Epidural  Level of Consciousness: awake, alert  and oriented  Airway and Oxygen Therapy: Patient Spontanous Breathing  Post-op Pain: none  Post-op Assessment: Post-op Vital signs reviewed, Patient's Cardiovascular Status Stable, Respiratory Function Stable, Patent Airway, No signs of Nausea or vomiting and Pain level controlled  Post-op Vital Signs: Reviewed and stable  Complications: No apparent anesthesia complications

## 2012-10-23 NOTE — Progress Notes (Signed)
Patient was referred for history of depression/anxiety.  * Referral screened out by Clinical Social Worker because none of the following criteria appear to apply:  ~ History of anxiety/depression during this pregnancy, or of post-partum depression.  ~ Diagnosis of anxiety and/or depression within last 3 years  ~ History of depression due to pregnancy loss/loss of child  OR  * Patient's symptoms currently being treated with medication and/or therapy.  Please contact the Clinical Social Worker if needs arise, or by the patient's request.  CSW unable to locate documentation of "panic attacks/ADHD" in chart. Intervention was not provided. Please reconsult if necessary.       

## 2012-10-23 NOTE — Progress Notes (Signed)
PPD#1 Pt c/o soreness in perineum. Has not used Sitz bath yet. Lochia-wnl VSSAF IMP/ Stable Plan/ Routine care.

## 2012-10-24 MED ORDER — OXYCODONE-ACETAMINOPHEN 5-325 MG PO TABS: 1.0000 | ORAL_TABLET | ORAL | Status: DC | PRN

## 2012-10-24 MED ORDER — DOCUSATE SODIUM 100 MG PO CAPS: 100.0000 mg | ORAL_CAPSULE | Freq: Two times a day (BID) | ORAL | Status: DC

## 2012-10-24 MED ORDER — IBUPROFEN 600 MG PO TABS: 600.0000 mg | ORAL_TABLET | Freq: Four times a day (QID) | ORAL | Status: DC | PRN

## 2012-10-24 NOTE — Discharge Summary (Signed)
Obstetric Discharge Summary Reason for Admission: onset of labor Prenatal Procedures: NST and ultrasound Intrapartum Procedures: spontaneous vaginal delivery Postpartum Procedures: none Complications-Operative and Postpartum: 2nd degree perineal laceration Hemoglobin  Date Value Range Status  10/23/2012 10.4* 12.0 - 15.0 g/dL Final     HCT  Date Value Range Status  10/23/2012 31.7* 36.0 - 46.0 % Final    Physical Exam:  General: alert, cooperative and appears stated age 27: appropriate Uterine Fundus: firm   Discharge Diagnoses: Term Pregnancy-delivered  Discharge Information: Date: 10/24/2012 Activity: pelvic rest Diet: routine Medications: Ibuprofen, Colace and Percocet Condition: improved Instructions: refer to practice specific booklet Discharge to: home Follow-up Information   Follow up with HORVATH,MICHELLE A, MD. (For a postpartum evaluation)    Contact information:   [address] [place] [address] [phone]       Newborn Data: Live born female  Birth Weight: 8 lb 14.7 oz (4046 g) APGAR: 8, 9  Home with mother.  Neha Waight H. 10/24/2012, 9:32 AM

## 2014-01-14 ENCOUNTER — Encounter (HOSPITAL_COMMUNITY): Payer: Self-pay

## 2014-01-14 ENCOUNTER — Inpatient Hospital Stay (HOSPITAL_COMMUNITY)
Admission: AD | Admit: 2014-01-14 | Discharge: 2014-01-14 | Disposition: A | Discharge status: Home / Self Care | Payer: 59 | Source: Ambulatory Visit | Attending: Obstetrics and Gynecology | Admitting: Obstetrics and Gynecology

## 2014-01-14 DIAGNOSIS — G43909 Migraine, unspecified, not intractable, without status migrainosus: Secondary | ICD-10-CM | POA: Insufficient documentation

## 2014-01-14 DIAGNOSIS — R002 Palpitations: Secondary | ICD-10-CM | POA: Insufficient documentation

## 2014-01-14 DIAGNOSIS — F17200 Nicotine dependence, unspecified, uncomplicated: Secondary | ICD-10-CM | POA: Insufficient documentation

## 2014-01-14 DIAGNOSIS — K219 Gastro-esophageal reflux disease without esophagitis: Secondary | ICD-10-CM | POA: Insufficient documentation

## 2014-01-14 DIAGNOSIS — R209 Unspecified disturbances of skin sensation: Secondary | ICD-10-CM | POA: Insufficient documentation

## 2014-01-14 DIAGNOSIS — R200 Anesthesia of skin: Secondary | ICD-10-CM

## 2014-01-14 HISTORY — DX: Migraine, unspecified, not intractable, without status migrainosus: G43.909

## 2014-01-14 LAB — POCT PREGNANCY, URINE: Preg Test, Ur: NEGATIVE

## 2014-01-14 NOTE — MAU Note (Signed)
Pt reports yesterday she had a numb feeling all down the right side of her body, stopped during the middle of the night. Started again this afternoon.

## 2014-01-14 NOTE — MAU Provider Note (Signed)
History     CSN: [id number]  Arrival date and time: 01/14/14 2139   None     No chief complaint on file.  HPI This is a 28 y.o. nonpregnant female who presents with c/o numbness on the right side of her body. States there is no tingling or weakness. No facial droop, though her face feels numb "like it could droop".  Denies change in speech or perception of speech. Denies weakness of any extremity.  Denies H/A, but has hx migraines. Takes Vyvanse and is worried this might be a side effect.  Has a doctor that prescribes that but does not have a primary doctor, [facility]".    RN Note:  Pt reports yesterday she had a numb feeling all down the right side of her body, stopped during the middle of the night. Started again this afternoon.        OB History   Grav Para Term Preterm Abortions TAB SAB Ect Mult Living   1 1 1       1       Past Medical History  Diagnosis Date  . Palpitations   . Facial swelling   . Panic attack   . Neck pain   . Chest pain   . History of ADHD   . Migraine   . GERD (gastroesophageal reflux disease)   . Migraines     Past Surgical History  Procedure Laterality Date  . Mouth surgery    . Wisdom te  2014    Family History  Problem Relation Age of Onset  . Cancer Mother   . Heart disease Paternal Grandmother     History  Substance Use Topics  . Smoking status: Current Some Day Smoker -- 1.00 packs/day for 7 years    Types: Cigarettes  . Smokeless tobacco: Not on file  . Alcohol Use: Yes     Comment: socially    Allergies:  Allergies  Allergen Reactions  . Codeine Nausea Only and Other (See Comments)    Causes sweating and dry heaving.  . Prednisone Nausea And Vomiting  . Amoxicillin Nausea Only    Prescriptions prior to admission  Medication Sig Dispense Refill  . Biotin 5000 MCG CAPS Take 2 capsules by mouth daily.       . Cyanocobalamin (VITAMIN B 12 PO) Take 2 tablets by mouth daily.       Marland Kitchen. ibuprofen  (ADVIL,MOTRIN) 600 MG tablet Take 1 tablet (600 mg total) by mouth every 6 (six) hours as needed for pain.  90 tablet  0  . lisdexamfetamine (VYVANSE) 40 MG capsule Take 40 mg by mouth every morning.      Marland Kitchen. omeprazole (PRILOSEC) 40 MG capsule Take 40 mg by mouth daily.      . Prenatal Vit-Fe Fumarate-FA (PRENATAL MULTIVITAMIN) TABS Take 1 tablet by mouth daily.      . SUMAtriptan (IMITREX) 50 MG tablet Take 50 mg by mouth every 2 (two) hours as needed for migraine or headache. May repeat in 2 hours if headache persists or recurs.        Review of Systems  Constitutional: Negative for fever, chills and malaise/fatigue.  Eyes: Negative for blurred vision and double vision.  Respiratory: Negative for shortness of breath.   Cardiovascular: Negative for chest pain, palpitations and leg swelling.  Gastrointestinal: Negative for nausea, vomiting and abdominal pain (though had "a little cramping" yesterday).  Musculoskeletal: Negative for back pain, falls, joint pain, myalgias and neck  pain.  Skin: Negative for rash.  Neurological: Positive for sensory change. Negative for dizziness, tingling, tremors, speech change, focal weakness, seizures, loss of consciousness, weakness and headaches.   Physical Exam   Blood pressure 139/85, pulse 96, temperature 98.4 F (36.9 C), temperature source Oral, resp. rate 18, height 5\' 5"  (1.651 m), weight 71.668 kg (158 lb), last menstrual period 12/21/2013, SpO2 100.00%, unknown if currently breastfeeding.  Physical Exam  Constitutional: She is oriented to person, place, and time. She appears well-developed and well-nourished. No distress.  HENT:  Head: Normocephalic and atraumatic.  Eyes: EOM are normal. Right eye exhibits no discharge. Left eye exhibits no discharge.  Neck: Normal range of motion. Neck supple. No tracheal deviation present.  Cardiovascular: Normal rate, regular rhythm, normal heart sounds and intact distal pulses.  Exam reveals no gallop and no  friction rub.   No murmur heard. Respiratory: Effort normal and breath sounds normal. No respiratory distress. She has no wheezes. She has no rales. She exhibits no tenderness.  GI: Soft. There is no tenderness. There is no rebound and no guarding.  Musculoskeletal: Normal range of motion. She exhibits no edema and no tenderness.  Neurological: She is alert and oriented to person, place, and time. She has normal strength. She displays no tremor. A sensory deficit (states less sensation on Right, from head to toes) is present. She exhibits normal muscle tone. She displays no seizure activity. Coordination and gait normal. GCS eye subscore is 4. GCS verbal subscore is 5. GCS motor subscore is 6.  Bilateral dorsiflexion and plantar flexion normal Smile equal Speech normal  Skin: Skin is warm and dry.  Psychiatric: She has a normal mood and affect.    MAU Course  Procedures  MDM Discussed with [facility] physician and Dr Henderson Cloud They do not feel she needs to be seen there emergently  Assessment and Plan  A:  Numbness/change in sensation on right side of body      History of migraines, no headache currently      Grossly normal neuro exam  P:  Discussed as above       Reviewed with patient, ER MD does not feel emergency evaluation is necessary       Will call office in AM to get outpatient neuro referral       Discussed value in obtaining a personal family physician       Neuro warning signs reviewed, if develops any other symptoms, go to ED  [facility] 01/14/2014, 10:13 PM

## 2014-01-14 NOTE — Discharge Instructions (Signed)

## 2014-01-18 ENCOUNTER — Ambulatory Visit (INDEPENDENT_AMBULATORY_CARE_PROVIDER_SITE_OTHER): Payer: 59 | Admitting: Neurology

## 2014-01-18 ENCOUNTER — Encounter: Payer: Self-pay | Admitting: Neurology

## 2014-01-18 VITALS — Ht 66.0 in | Wt 160.0 lb

## 2014-01-18 DIAGNOSIS — G43909 Migraine, unspecified, not intractable, without status migrainosus: Secondary | ICD-10-CM

## 2014-01-18 MED ORDER — TOPIRAMATE 25 MG PO TABS: ORAL_TABLET | ORAL | Status: DC

## 2014-01-18 NOTE — Patient Instructions (Signed)
1. Stop using vyvanse 2. We still consider your condition related to migraine with the influence of new medication 3. Headache diary 4. Start topamax 25mg  at night daily for 5-7 days and then 25mg  twice a day for 5-7 days and then 25mg  in the morning and 50mg  at night for 5-7 days and then 50mg  twice a day. 5. Continue to monitor your numbness and if they do not go away for the next 3 days, call clinic again and we may need to do some further testing. 6. Follow up in one month

## 2014-01-19 NOTE — Progress Notes (Signed)
NEUROLOGY CLINIC NEW PATIENT NOTE  NAME: Darlene Henderson DOB: 1986/02/02  I saw Darlene Henderson as a new patient in the neurovascular clinic today regarding  Chief Complaint  Patient presents with  . Neurologic Problem    right side weakness #1  .  HPI: Darlene Henderson is a 28 y.o. female with PMH of ADHD and migraine who presents as new patient for right sided numbness. She stated that she woke up Wednesday felt numbness and feeling of heaviness on the right leg and arm, no tingling or pain. She was able to do all daily activity. Thursday and Friday, she continue to have the arm and leg weakness but she also felt some numbness on the right face but no facial droop or slurry speech. She stopped her Vyvanse Saturday and over the weekend she felt arm and leg numbness is better but face numbness still there. Monday (today) arm leg numbness resolved but face continues. There was no associated headache. She came over to clinic for evaluation.  She has Hx of ADHD and had tried many medications as kid include addrell and ritalin. But stopped for a while. 3 years ago started Vyvanse but not able to tolerate due to back pain. Then tried another one (do not remember name) also not able to tolerate. 2 years ago she pregnant with her daughter so she stopped all the medication. About 4 months ago, she restarted Vyvanse without problem but stopped after two months. She just restarted Vyvanse about on 7/10.  She also has hx of migarine started as kid. Lasting hours to days, no N/V, but phono and photophobia, prefer dark room and sleep over it. No aura. Pain could stay at the top of head but also can travel from one side to another. Caffeine or hot dog can make it worse and sleep and ice pack make it better. imitrex helps and abort the HA in 30 min, sometimes takes ibuprofen if mild. She had no HA during pregnancy 2 years ago but then 6 months post partum, she started to have migraine again and gradually increased  frequency. Currently once a week lasting hours, last one was one week ago on the weekend. Last weekend had this episode of numbness and tingling on the right side without headache. She has no preventive meds for migraine.  She is smoker now since one month ago 5-6 cig per day. She had smoked 5 years ago but stopped. She drink 2-5 drinks intermittently not every day. Denies illicit drugs. 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 Mom has migraine and benign tumor in the head. Sister and father do not have hx of migraine.  Outside reports reviewed: none.  Past Medical History  Diagnosis Date  . Palpitations   . Facial swelling   . Panic attack   . Neck pain   . Chest pain   . History of ADHD   . Migraine   . GERD (gastroesophageal reflux disease)   . Migraines    Past Surgical History  Procedure Laterality Date  . Mouth surgery    . Wisdom te  2014   Family History  Problem Relation Age of Onset  . Cancer Mother   . Heart disease Paternal Grandmother    Current Outpatient Prescriptions  Medication Sig Dispense Refill  . Biotin 5000 MCG CAPS Take 2 capsules by mouth daily.       . Cyanocobalamin (VITAMIN B 12 PO) Take 2 tablets by mouth daily.       Marland Kitchen  ibuprofen (ADVIL,MOTRIN) 600 MG tablet Take 1 tablet (600 mg total) by mouth every 6 (six) hours as needed for pain.  90 tablet  0  . lisdexamfetamine (VYVANSE) 40 MG capsule Take 40 mg by mouth every morning.      Marland Kitchen. omeprazole (PRILOSEC) 40 MG capsule Take 40 mg by mouth daily.      . Prenatal Vit-Fe Fumarate-FA (PRENATAL MULTIVITAMIN) TABS Take 1 tablet by mouth daily.      . SUMAtriptan (IMITREX) 50 MG tablet Take 50 mg by mouth every 2 (two) hours as needed for migraine or headache. May repeat in 2 hours if headache persists or recurs.      . topiramate (TOPAMAX) 25 MG tablet Start topamax 25mg  at night daily for 5-7 days and then 25mg  twice a day for 5-7 days and then 25mg  in the morning and 50mg  at night for 5-7  days and then 50mg  twice a day  120 tablet  2   No current facility-administered medications for this visit.   Allergies  Allergen Reactions  . Codeine Nausea Only and Other (See Comments)    Causes sweating and dry heaving.  Marland Kitchen. Hydrocod Polst-Cpm Polst Er     Dry heaves, sweat  . Prednisone Nausea And Vomiting  . Amoxicillin Nausea Only   History   Social History  . Marital Status: Married    Spouse Name: N/A    Number of Children: 1  . Years of Education: college   Occupational History  . front desk    Social History Main Topics  . Smoking status: Current Some Day Smoker -- 1.00 packs/day for 7 years    Types: Cigarettes  . Smokeless tobacco: Not on file  . Alcohol Use: Yes     Comment: socially  . Drug Use: No  . Sexual Activity: Yes    Birth Control/ Protection: IUD     Comment: mirena   Other Topics Concern  . Not on file   Social History Narrative   Patient lives with her husband and daughter   Patient right handed   Patient drink coffee and soda    Review of Systems Full 14 system review of systems performed and notable only for those listed, all others are neg:  Constitutional: N/A  Cardiovascular: N/A  Ear/Nose/Throat: blurry vision Skin: N/A  Eyes: N/A  Respiratory: N/A  Gastroitestinal: N/A  Hematology/Lymphatic: easy bruising Endocrine: increased thirst Musculoskeletal: N/A  Allergy/Immunology: N/A  Neurological: numbness   Psychiatric: anxiety, not enough sleep, insomnia, sleepiness   Physical Exam  Filed Vitals:   01/18/14 0807  Height: 5\' 6"  (1.676 m)  Weight: 160 lb (72.576 kg)  BP 120/70 and R 20 and Pulse 70  General - Well nourished, well developed, in no apparent distress.  Ophthalmologic - Sharp disc margins OU.  Cardiovascular - Regular rate and rhythm with no murmur. Carotid pulses were 2+ without bruits .   Neck - supple, no nuchal rigidity .  Mental Status -  Level of arousal and orientation to time, place, and  person were intact. Language including expression, naming, repetition, comprehension, reading, and writing was assessed and found intact. Attention span and concentration were normal. Recent and remote memory were intact. Fund of Knowledge was assessed and was intact.  Cranial Nerves II - XII - II - Vision intact OU. III, IV, VI - Extraocular movements intact. V - Facial sensation exam showed left V1 and V3 decreased light touch and pinprick on the right but symmetric V2. Frontal  vibration also decreased on the right showing midline split. VII - Facial movement intact bilaterally. VIII - Hearing & vestibular intact bilaterally. X - Palate elevates symmetrically. XI - Chin turning & shoulder shrug intact bilaterally. XII - Tongue protrusion intact.  Motor Strength - The patient's strength was normal in all extremities and pronator drift was absent.  Bulk was normal and fasciculations were absent.   Motor Tone - Muscle tone was assessed at the neck and appendages and was normal.  Reflexes - The patient's reflexes were normal in all extremities and she had no pathological reflexes.  Sensory - Light touch, temperature/pinprick symmetrical in the arms but patchy decreased on the right, vibration symmetrical bilaterally, and Romberg testing were assessed and were normal.    Coordination - The patient had normal movements in the hands and feet with no ataxia or dysmetria.  Tremor was absent.  Gait and Station - The patient's transfers, posture, gait, station, and turns were observed as normal.   Imaging none  Lab Review none  Assessments: NIHSS: 1 Rankin: 1   Assessment and Plan:   In summary, Darlene Henderson presents with left arm and leg and face numbness, improving at arm and leg. She had hx of ADHD and migraine and on Vyvanse but stopped. After stopping the meds, her symptoms are improving. She had HA once a week but this episode does not associate with headache. She does have side  effects with multiple meds in the past and her migraine can be associated with specific foods. Neuro exam normal except subjective decreased sensation in patchy pattern in the face and leg, but also showed frontal bone vibration midline split, indicating some functional component. I have really low suspicious for any cerebral vascular event. I am more considering she had complicated migraine without headache in the setting of new start of a medication and frequent migraine attacks. I would hold off the vyvanse and start with prophylaxis of migraine with topamax. She will call us if her numbness not resolved in 2-3 days. We may think about imaging study if needed at that time. She was advised on smoking cessation and sleep hygiene.   - discontinue vyvanse - add topamax for migraine prophylaxis - smoking cessation counseling provided. - headache diary to monitor HA - symptoms if not resolve for the next 3 days, she will call our office again - RTC in clinic in one month   Meds ordered this encounter  Medications  . topiramate (TOPAMAX) 25 MG tablet    Sig: Start topamax 25mg  at night daily for 5-7 days and then 25mg  twice a day for 5-7 days and then 25mg  in the morning and 50mg  at night for 5-7 days and then 50mg  twice a day    Dispense:  120 tablet    Refill:  2   Patient Instructions  1. Stop using vyvanse 2. We still consider your condition related to migraine with the influence of new medication 3. Headache diary 4. Start topamax 25mg  at night daily for 5-7 days and then 25mg  twice a day for 5-7 days and then 25mg  in the morning and 50mg  at night for 5-7 days and then 50mg  twice a day. 5. Continue to monitor your numbness and if they do not go away for the next 3 days, call clinic again and we may need to do some further testing. 6. Follow up in one month   Thank you very much for the opportunity to participate in the care of this  patient.  Please do not hesitate to call if any questions  or concerns arise.  Marvel Plan, MD PhD [facility] [address], [address] [place], [place] [address] [phone]

## 2014-04-26 ENCOUNTER — Encounter: Payer: Self-pay | Admitting: Neurology

## 2014-05-24 DIAGNOSIS — F988 Other specified behavioral and emotional disorders with onset usually occurring in childhood and adolescence: Secondary | ICD-10-CM | POA: Insufficient documentation

## 2014-07-20 DIAGNOSIS — D751 Secondary polycythemia: Secondary | ICD-10-CM | POA: Insufficient documentation

## 2014-08-31 ENCOUNTER — Ambulatory Visit
Admission: RE | Admit: 2014-08-31 | Discharge: 2014-08-31 | Disposition: A | Discharge status: Home / Self Care | Payer: Medicaid Other | Source: Ambulatory Visit | Attending: Internal Medicine | Admitting: Internal Medicine

## 2014-08-31 ENCOUNTER — Other Ambulatory Visit: Payer: Self-pay | Admitting: Internal Medicine

## 2014-08-31 DIAGNOSIS — R0602 Shortness of breath: Secondary | ICD-10-CM

## 2017-11-20 DIAGNOSIS — Z30433 Encounter for removal and reinsertion of intrauterine contraceptive device: Secondary | ICD-10-CM | POA: Diagnosis not present

## 2017-11-26 DIAGNOSIS — Z79899 Other long term (current) drug therapy: Secondary | ICD-10-CM | POA: Diagnosis not present

## 2018-01-01 DIAGNOSIS — Z30431 Encounter for routine checking of intrauterine contraceptive device: Secondary | ICD-10-CM | POA: Diagnosis not present

## 2018-02-26 DIAGNOSIS — Z79899 Other long term (current) drug therapy: Secondary | ICD-10-CM | POA: Diagnosis not present

## 2018-02-26 DIAGNOSIS — G43909 Migraine, unspecified, not intractable, without status migrainosus: Secondary | ICD-10-CM | POA: Diagnosis not present

## 2018-04-01 DIAGNOSIS — G43001 Migraine without aura, not intractable, with status migrainosus: Secondary | ICD-10-CM | POA: Diagnosis not present

## 2018-04-01 DIAGNOSIS — Z79899 Other long term (current) drug therapy: Secondary | ICD-10-CM | POA: Diagnosis not present

## 2018-05-29 ENCOUNTER — Other Ambulatory Visit: Payer: Self-pay

## 2018-05-29 ENCOUNTER — Encounter: Payer: Self-pay | Admitting: Family Medicine

## 2018-05-29 ENCOUNTER — Ambulatory Visit (INDEPENDENT_AMBULATORY_CARE_PROVIDER_SITE_OTHER): Payer: 59 | Admitting: Family Medicine

## 2018-05-29 VITALS — BP 98/62 | HR 67 | Temp 98.2°F | Ht 66.5 in | Wt 158.6 lb

## 2018-05-29 DIAGNOSIS — F988 Other specified behavioral and emotional disorders with onset usually occurring in childhood and adolescence: Secondary | ICD-10-CM

## 2018-05-29 DIAGNOSIS — G43909 Migraine, unspecified, not intractable, without status migrainosus: Secondary | ICD-10-CM

## 2018-05-29 DIAGNOSIS — R42 Dizziness and giddiness: Secondary | ICD-10-CM

## 2018-05-29 DIAGNOSIS — Z789 Other specified health status: Secondary | ICD-10-CM

## 2018-05-29 DIAGNOSIS — F430 Acute stress reaction: Secondary | ICD-10-CM

## 2018-05-29 DIAGNOSIS — Z8249 Family history of ischemic heart disease and other diseases of the circulatory system: Secondary | ICD-10-CM | POA: Diagnosis not present

## 2018-05-29 LAB — CBC
HCT: 44 % (ref 36.0–46.0)
Hemoglobin: 15 g/dL (ref 12.0–15.0)
MCHC: 34.1 g/dL (ref 30.0–36.0)
MCV: 96 fl (ref 78.0–100.0)
PLATELETS: 177 10*3/uL (ref 150.0–400.0)
RBC: 4.59 Mil/uL (ref 3.87–5.11)
RDW: 12.5 % (ref 11.5–15.5)
WBC: 5.4 10*3/uL (ref 4.0–10.5)

## 2018-05-29 LAB — COMPREHENSIVE METABOLIC PANEL
ALT: 18 U/L (ref 0–35)
AST: 17 U/L (ref 0–37)
Albumin: 4.9 g/dL (ref 3.5–5.2)
Alkaline Phosphatase: 46 U/L (ref 39–117)
BUN: 9 mg/dL (ref 6–23)
CO2: 28 mEq/L (ref 19–32)
CREATININE: 0.65 mg/dL (ref 0.40–1.20)
Calcium: 9.5 mg/dL (ref 8.4–10.5)
Chloride: 102 mEq/L (ref 96–112)
GFR: 112.78 mL/min (ref >60.00)
GLUCOSE: 92 mg/dL (ref 70–99)
POTASSIUM: 4 meq/L (ref 3.5–5.1)
Sodium: 137 mEq/L (ref 135–145)
TOTAL PROTEIN: 7.7 g/dL (ref 6.0–8.3)
Total Bilirubin: 0.4 mg/dL (ref 0.2–1.2)

## 2018-05-29 LAB — LIPID PANEL
Cholesterol: 195 mg/dL (ref 0–200)
HDL: 59.8 mg/dL (ref >39.00)
LDL CALC: 108 mg/dL — AB (ref 0–99)
NONHDL: 134.86
Total CHOL/HDL Ratio: 3
Triglycerides: 136 mg/dL (ref 0.0–149.0)
VLDL: 27.2 mg/dL (ref 0.0–40.0)

## 2018-05-29 LAB — TSH: TSH: 0.62 u[IU]/mL (ref 0.35–4.50)

## 2018-05-29 NOTE — Patient Instructions (Signed)
Please schedule a follow up appointment with me in 6 weeks for a Recheck  Decrease Alcohol Consumption  Start Relaxation Techniques

## 2018-05-29 NOTE — Progress Notes (Signed)
Subjective  CC:  Chief Complaint  Patient presents with  . Establish Care    Transfer from [facility], last Physical 4-5 years ago   . Dizziness    patient states she is unsteady on her feet  . Edema    in hands x 3 weeks     HPI: Darlene Henderson is a 32 y.o. female who presents to [facility] today to establish care with me as a new patient.   She has the following concerns or needs:  32 yo female with long history of migraine headaches since childhood treated with imitrex as needed: triggers include caffeine, stress, msg, hot dogs, chinese food. Stopped caffeine two months ago and now with only monthly headache. Was having 9-10 per month prior to that. No h/o preventive meds or brain imaging. Of note, FH h/o brain tumor so she has not really wanted further evaluation out of fear.   Mood problems: on wellbutrin for about 1-2 years from gyn for irritability and quick to anger. Relates to stress of parenting while working fullltime. Now with high stress at work x 2 months due to coworker issues. She loves her job overall. Has been self medicating the stress with beer: drinking 4-6 bud lights nightly. Having disruptive sleep as expected. Negative CAGE screen. Husband drinks with her.   Concerned because has have several episodes of feeling off balance; lasted seconds. No associated sxs. Also has gained about 10 pounds and feels fingers are swollen. No cp, palpitations, weakness.  Assessment  1. Light headedness   2. Family history of premature CAD   3. Migraine without status migrainosus, not intractable, unspecified migraine type   4. Attention deficit disorder, unspecified hyperactivity presence   5. Stress reaction   6. Alcohol consumption four to six days per week      Plan   Counseling done: most c/w stress reaction. rec decreasing alcohol use, continue wellbutrin, relaxation techniques. Recheck 6 weeks.   Weight gain due increased calories.  rec exercise.   ADD is well controlled per ADD specialist  Migraines: now doing well. Will monitor. Continue abortive imitrex only for now.    Follow up:  Return in about 6 weeks (around 07/10/2018) for recheck. Orders Placed This Encounter  Procedures  . CBC  . TSH  . Comprehensive metabolic panel  . Lipid panel   No orders of the defined types were placed in this encounter.    Depression screen PHQ 2/9 05/29/2018  Decreased Interest 0  Down, Depressed, Hopeless 0  PHQ - 2 Score 0    We updated and reviewed the patient's past history in detail and it is documented below.  Patient Active Problem List   Diagnosis Date Noted  . Family history of premature CAD 05/29/2018  . 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 ADD (attention deficit disorder) 05/24/2014  . Migraine headache 05/11/2011   Health Maintenance  Topic Date Due  . INFLUENZA VACCINE  06/25/2018 (Originally 01/23/2018)  . TETANUS/TDAP  05/30/2019 (Originally 02/08/2006)  . PAP SMEAR  08/23/2020  . HIV Screening  Completed   There is no immunization history for the selected administration types on file for this patient. Current Meds  Medication Sig  . amphetamine-dextroamphetamine (ADDERALL XR) 15 MG 24 hr capsule Adderall XR 15 mg capsule,extended release  TAKE 1 CAPSULE BY MOUTH DAILY MAY BE FILLED 30 DAYS AFTER DATE PRESCRIBED  . doxycycline (VIBRAMYCIN) 50  MG capsule TAKE 1 CAPSULE BY MOUTH TWICE A DAY AS DIRECTED  . levonorgestrel (MIRENA) 20 MCG/24HR IUD 1 each by Intrauterine route once.  . SUMAtriptan (IMITREX) 50 MG tablet Take 50 mg by mouth every 2 (two) hours as needed for migraine or headache. May repeat in 2 hours if headache persists or recurs.  . [DISCONTINUED] buPROPion (WELLBUTRIN XL) 300 MG 24 hr tablet bupropion HCl XL 300 mg 24 hr tablet, extended release  1 po daily    Allergies: Patient is allergic to codeine; hydrocod polst-cpm polst er; prednisone;  and amoxicillin. Past Medical History Patient  has a past medical history of Chest pain, Facial swelling, GERD (gastroesophageal reflux disease), History of ADHD, Migraine, Migraines, Neck pain, Palpitations, and Panic attack. Past Surgical History Patient  has a past surgical history that includes Mouth surgery and wisdom te (2014). Family History: Patient family history includes Colon cancer in her maternal aunt, maternal uncle, and mother; Heart disease in her maternal grandfather and paternal grandmother; Lupus in her sister. Social History:  Patient  reports that she quit smoking about 2 years ago. Her smoking use included cigarettes. She has a 7.00 pack-year smoking history. She has never used smokeless tobacco. She reports that she drinks alcohol. She reports that she does not use drugs.  Review of Systems: Constitutional: negative for fever or malaise Ophthalmic: negative for photophobia, double vision or loss of vision Cardiovascular: negative for chest pain, dyspnea on exertion, or new LE swelling Respiratory: negative for SOB or persistent cough Gastrointestinal: negative for abdominal pain, change in bowel habits or melena Genitourinary: negative for dysuria or gross hematuria Musculoskeletal: negative for new gait disturbance or muscular weakness Integumentary: negative for new or persistent rashes Neurological: negative for TIA or stroke symptoms Psychiatric: negative for SI or delusions Allergic/Immunologic: negative for hives  Patient Care Team    Relationship Specialty Notifications Start End  Willow OraAndy, Milena Liggett L, MD PCP - General Family Medicine  05/29/18     Objective  Vitals: BP 98/62   Pulse 67   Temp 98.2 F (36.8 C)   Ht 5' 6.5" (1.689 m)   Wt 158 lb 9.6 oz (71.9 kg)   LMP 05/14/2018   SpO2 99%   BMI 25.22 kg/m  General:  Well developed, well nourished, no acute distress  Psych:  Alert and oriented,normal mood and affect HEENT:  Normocephalic, atraumatic,  non-icteric sclera, PERRL, oropharynx is without mass or exudate, supple neck without adenopathy, mass or thyromegaly Cardiovascular:  RRR without gallop, rub or murmur, nondisplaced PMI Respiratory:  Good breath sounds bilaterally, CTAB with normal respiratory effort Gastrointestinal: normal bowel sounds, soft, non-tender, no noted masses. No HSM MSK: no deformities, contusions. Joints are without erythema or swelling Skin:  Warm, no rashes or suspicious lesions noted Neurologic:    Mental status is normal. Gross motor and sensory exams are normal. Normal gait, fine tremor present   Commons side effects, risks, benefits, and alternatives for medications and treatment plan prescribed today were discussed, and the patient expressed understanding of the given instructions. Patient is instructed to call or message via MyChart if he/she has any questions or concerns regarding our treatment plan. No barriers to understanding were identified. We discussed Red Flag symptoms and signs in detail. Patient expressed understanding regarding what to do in case of urgent or emergency type symptoms.   Medication list was reconciled, printed and provided to the patient in AVS. Patient instructions and summary information was reviewed with the patient as documented in  the AVS. This note was prepared with assistance of Dragon voice recognition software. Occasional wrong-word or sound-a-like substitutions may have occurred due to the inherent limitations of voice recognition software

## 2018-05-30 DIAGNOSIS — Z79899 Other long term (current) drug therapy: Secondary | ICD-10-CM | POA: Diagnosis not present

## 2018-07-08 ENCOUNTER — Ambulatory Visit (INDEPENDENT_AMBULATORY_CARE_PROVIDER_SITE_OTHER): Payer: 59 | Admitting: Family Medicine

## 2018-07-08 ENCOUNTER — Encounter: Payer: Self-pay | Admitting: Family Medicine

## 2018-07-08 ENCOUNTER — Other Ambulatory Visit: Payer: Self-pay

## 2018-07-08 VITALS — BP 114/56 | HR 66 | Temp 98.1°F | Resp 16 | Ht 67.0 in | Wt 159.0 lb

## 2018-07-08 DIAGNOSIS — Z8659 Personal history of other mental and behavioral disorders: Secondary | ICD-10-CM | POA: Diagnosis not present

## 2018-07-08 DIAGNOSIS — F430 Acute stress reaction: Secondary | ICD-10-CM

## 2018-07-08 DIAGNOSIS — Z975 Presence of (intrauterine) contraceptive device: Secondary | ICD-10-CM | POA: Insufficient documentation

## 2018-07-08 DIAGNOSIS — G43909 Migraine, unspecified, not intractable, without status migrainosus: Secondary | ICD-10-CM

## 2018-07-08 NOTE — Progress Notes (Signed)
Subjective  CC:  Chief Complaint  Patient presents with  . Follow-up    She doing much better and no dizziness    HPI: Darlene Henderson is a 33 y.o. female who presents to the office today to address the problems listed above in the chief complaint.  Doing much better.  Work stress is resolved since her problematic coworker is no longer there. She is less irritable. Happier. No longer having dizzy spells. Drinking less and trying to eat better.   Migraines: only had 2 since last visit. She is fine with that and defers further workup or treatment options at this time.   Declines tdap and flu vaccine.   Reviewed nl lab work from last visit.   Wt Readings from Last 3 Encounters:  07/08/18 159 lb (72.1 kg)  05/29/18 158 lb 9.6 oz (71.9 kg)  01/18/14 160 lb (72.6 kg)    Assessment  1. Stress reaction   2. Migraine without status migrainosus, not intractable, unspecified migraine type      Plan   Stress reaction:  Resolved. Reassured. Reinforced healthy lifestyle including limiting alcohol consumption and a healthy low fat diet.   Migraines. Continue to monitor.   Follow up: Return in about 11 months (around 06/08/2019) for complete physical.  Visit date not found  No orders of the defined types were placed in this encounter.  No orders of the defined types were placed in this encounter.     I reviewed the patients updated PMH, FH, and SocHx.    Patient Active Problem List   Diagnosis Date Noted  . IUD (intrauterine device) in place 07/08/2018  . Family history of premature CAD 05/29/2018  . 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 ADD (attention deficit disorder) 05/24/2014  . Migraine headache 05/11/2011   Current Meds  Medication Sig  . amphetamine-dextroamphetamine (ADDERALL XR) 15 MG 24 hr capsule Adderall XR 15 mg capsule,extended release  TAKE 1 CAPSULE BY MOUTH DAILY MAY BE FILLED 30 DAYS AFTER DATE PRESCRIBED  . doxycycline (VIBRAMYCIN) 50 MG capsule TAKE 1  CAPSULE BY MOUTH TWICE A DAY AS DIRECTED  . levonorgestrel (MIRENA) 20 MCG/24HR IUD 1 each by Intrauterine route once.  . SUMAtriptan (IMITREX) 50 MG tablet Take 50 mg by mouth every 2 (two) hours as needed for migraine or headache. May repeat in 2 hours if headache persists or recurs.    Allergies: Patient is allergic to codeine; hydrocod polst-cpm polst er; prednisone; and amoxicillin. Family History: Patient family history includes Colon cancer in her maternal aunt, maternal uncle, and mother; Heart disease in her maternal grandfather and paternal grandmother; Lupus in her sister. Social History:  Patient  reports that she quit smoking about 3 years ago. Her smoking use included cigarettes. She has a 7.00 pack-year smoking history. She has never used smokeless tobacco. She reports current alcohol use. She reports that she does not use drugs.  Review of Systems: Constitutional: Negative for fever malaise or anorexia Cardiovascular: negative for chest pain Respiratory: negative for SOB or persistent cough Gastrointestinal: negative for abdominal pain  Objective  Vitals: BP (!) 114/56   Pulse 66   Temp 98.1 F (36.7 C) (Oral)   Resp 16   Ht 5\' 7"  (1.702 m)   Wt 159 lb (72.1 kg)   LMP 07/06/2018   SpO2 98%   BMI 24.90 kg/m  General: no acute distress , A&Ox3  No visits with results within 1 Day(s) from this visit.  Latest known visit with results  is:  Office Visit on 05/29/2018  Component Date Value Ref Range Status  . WBC 05/29/2018 5.4  4.0 - 10.5 K/uL Final  . RBC 05/29/2018 4.59  3.87 - 5.11 Mil/uL Final  . Platelets 05/29/2018 177.0  150.0 - 400.0 K/uL Final  . Hemoglobin 05/29/2018 15.0  12.0 - 15.0 g/dL Final  . HCT 73/42/8768 44.0  36.0 - 46.0 % Final  . MCV 05/29/2018 96.0  78.0 - 100.0 fl Final  . MCHC 05/29/2018 34.1  30.0 - 36.0 g/dL Final  . RDW 11/57/2620 12.5  11.5 - 15.5 % Final  . TSH 05/29/2018 0.62  0.35 - 4.50 uIU/mL Final  . Sodium 05/29/2018 137  135  - 145 mEq/L Final  . Potassium 05/29/2018 4.0  3.5 - 5.1 mEq/L Final  . Chloride 05/29/2018 102  96 - 112 mEq/L Final  . CO2 05/29/2018 28  19 - 32 mEq/L Final  . Glucose, Bld 05/29/2018 92  70 - 99 mg/dL Final  . BUN 35/59/7416 9  6 - 23 mg/dL Final  . Creatinine, Ser 05/29/2018 0.65  0.40 - 1.20 mg/dL Final  . Total Bilirubin 05/29/2018 0.4  0.2 - 1.2 mg/dL Final  . Alkaline Phosphatase 05/29/2018 46  39 - 117 U/L Final  . AST 05/29/2018 17  0 - 37 U/L Final  . ALT 05/29/2018 18  0 - 35 U/L Final  . Total Protein 05/29/2018 7.7  6.0 - 8.3 g/dL Final  . Albumin 38/45/3646 4.9  3.5 - 5.2 g/dL Final  . Calcium 80/32/1224 9.5  8.4 - 10.5 mg/dL Final  . GFR 82/50/0370 112.78  >60.00 mL/min Final  . Cholesterol 05/29/2018 195  0 - 200 mg/dL Final  . Triglycerides 05/29/2018 136.0  0.0 - 149.0 mg/dL Final  . HDL 48/88/9169 59.80  >39.00 mg/dL Final  . VLDL 45/08/8880 27.2  0.0 - 40.0 mg/dL Final  . LDL Cholesterol 05/29/2018 108* 0 - 99 mg/dL Final  . Total CHOL/HDL Ratio 05/29/2018 3   Final  . NonHDL 05/29/2018 134.86   Final        Commons side effects, risks, benefits, and alternatives for medications and treatment plan prescribed today were discussed, and the patient expressed understanding of the given instructions. Patient is instructed to call or message via MyChart if he/she has any questions or concerns regarding our treatment plan. No barriers to understanding were identified. We discussed Red Flag symptoms and signs in detail. Patient expressed understanding regarding what to do in case of urgent or emergency type symptoms.   Medication list was reconciled, printed and provided to the patient in AVS. Patient instructions and summary information was reviewed with the patient as documented in the AVS. This note was prepared with assistance of Dragon voice recognition software. Occasional wrong-word or sound-a-like substitutions may have occurred due to the inherent limitations of  voice recognition software

## 2018-07-08 NOTE — Patient Instructions (Addendum)
Please return in Dec 2020 for your annual complete physical; please come fasting.  If you have any questions or concerns, please don't hesitate to send me a message via MyChart or call the office at [phone]. Thank you for visiting with [place] today! It's our pleasure caring for you.

## 2018-07-21 DIAGNOSIS — L249 Irritant contact dermatitis, unspecified cause: Secondary | ICD-10-CM | POA: Diagnosis not present

## 2018-11-11 ENCOUNTER — Encounter: Payer: Self-pay | Admitting: Family Medicine

## 2018-11-27 ENCOUNTER — Ambulatory Visit (INDEPENDENT_AMBULATORY_CARE_PROVIDER_SITE_OTHER): Payer: 59 | Admitting: Family Medicine

## 2018-11-27 ENCOUNTER — Encounter: Payer: Self-pay | Admitting: Family Medicine

## 2018-11-27 DIAGNOSIS — F430 Acute stress reaction: Secondary | ICD-10-CM

## 2018-11-27 MED ORDER — BUPROPION HCL ER (XL) 150 MG PO TB24: ORAL_TABLET | ORAL | 0 refills | Status: DC

## 2018-11-27 NOTE — Progress Notes (Signed)
Virtual Visit via Video Note  SUBJECTIVE CC:  Chief Complaint  Patient presents with  . Follow-up    medication changes     I connected with Donaldeen Hlinka Viveiros on 11/27/18 at  1:20 PM EDT by a video enabled telemedicine application and verified that I am speaking with the correct person using two identifiers. Location patient: Home Location provider: [facility] Persons participating in the virtual visit: [name], [name], MD [name], RN   I discussed the limitations of evaluation and management by telemedicine and the availability of in person appointments. The patient expressed understanding and agreed to proceed.  HPI: Darlene Henderson is a 33 y.o. female who was contacted today to address the problems listed above in the chief complaint/mood.  Darlene Henderson has been on Wellbutrin for the last 2 years or so treating stress reaction that was mainly related to single parenting kind almost due to a busy husband's schedule and some work stressors.  Wellbutrin did very well for her, resolving her symptoms.  Life stressors have now resolved.  As well, she has learned to manage her stress in a much more productive manner.  She no longer gets symptoms of anxiety or irritability.  For these reasons, she would like to come off of the medication.  She has no other history of anxiety or depression.  She never before has been treated with an anxiolytic or mood medication.  She is well-controlled ADD and migraines have been stable. Depression screen PHQ 2/9 05/29/2018  Decreased Interest 0  Down, Depressed, Hopeless 0  PHQ - 2 Score 0     ASSESSMENT 1. Stress reaction      Stress reaction/anxiety: She is a good candidate for weaning off the medication.  She should do well.  Low risk of relapse.  Recommend 3-week wean.  See after visit summary for instructions.  Medications ordered accordingly.  She will follow-up with me if she has problems.  I discussed  the assessment and treatment plan with the patient. The patient was provided an opportunity to ask questions and all were answered. The patient agreed with the plan and demonstrated an understanding of the instructions.   The patient was advised to call back or seek an in-person evaluation if the symptoms worsen or if the condition fails to improve as anticipated. Follow up: Return for as scheduled, complete physical.  Visit date not found  Meds ordered this encounter  Medications  . buPROPion (WELLBUTRIN XL) 150 MG 24 hr tablet    Sig: Take 1 tablet (150 mg total) by mouth daily for 14 days, THEN 1 tablet (150 mg total) every other day for 7 days, THEN 1 tablet (150 mg total) 2 (two) times a week for 7 days.    Dispense:  19 tablet    Refill:  0      I reviewed the patients updated PMH, FH, and SocHx.    Patient Active Problem List   Diagnosis Date Noted  . IUD (intrauterine device) in place 07/08/2018  . Family history of premature CAD 05/29/2018  . 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 ADD (attention deficit disorder) 05/24/2014  . Migraine headache 05/11/2011   Current Meds  Medication Sig  . amphetamine-dextroamphetamine (ADDERALL XR) 15 MG 24 hr capsule Take 15 mg by mouth daily.  Marland Kitchen buPROPion (WELLBUTRIN XL) 150 MG 24 hr tablet Take 1 tablet (150 mg total) by mouth daily for 14 days, THEN 1  tablet (150 mg total) every other day for 7 days, THEN 1 tablet (150 mg total) 2 (two) times a week for 7 days.  Marland Kitchen. doxycycline (VIBRAMYCIN) 50 MG capsule TAKE 1 CAPSULE BY MOUTH TWICE A DAY AS DIRECTED  . levonorgestrel (MIRENA) 20 MCG/24HR IUD 1 each by Intrauterine route once.  . SUMAtriptan (IMITREX) 50 MG tablet Take 50 mg by mouth every 2 (two) hours as needed for migraine or headache. May repeat in 2 hours if headache persists or recurs.    Allergies: Patient is allergic to codeine; hydrocod polst-cpm polst er; prednisone; and amoxicillin. Family History: Patient family history  includes Colon cancer in her maternal aunt, maternal uncle, and mother; Heart disease in her maternal grandfather and paternal grandmother; Lupus in her sister. Social History:  Patient  reports that she quit smoking about 3 years ago. Her smoking use included cigarettes. She has a 7.00 pack-year smoking history. She has never used smokeless tobacco. She reports current alcohol use. She reports that she does not use drugs.  Review of Systems: Constitutional: Negative for fever malaise or anorexia Cardiovascular: negative for chest pain Respiratory: negative for SOB or persistent cough Gastrointestinal: negative for abdominal pain  OBJECTIVE/OBSERVATIONS: General: no acute distress, well appearing, no apparent distress, well groomed Psych:  Alert and oriented x 3,normal mood, behavior, speech, dress, and thought processes.   Willow Oraamille L Lillyian Heidt, MD

## 2018-11-27 NOTE — Patient Instructions (Signed)
Please wean wellbutrin as follows (and as instructed on bottle): Take 150mg  daily for 14 days, Then 150mg  every other day for 7 days, Then 150mg  on T and F. Then stop   Let me know if you have any problems. Good luck!

## 2018-12-21 ENCOUNTER — Other Ambulatory Visit: Payer: Self-pay | Admitting: Family Medicine

## 2019-06-03 ENCOUNTER — Encounter: Payer: 59 | Admitting: Family Medicine

## 2019-06-25 ENCOUNTER — Telehealth: Payer: Self-pay | Admitting: Family Medicine

## 2019-06-25 NOTE — Telephone Encounter (Signed)
Medication Refill - Medication: SUMAtriptan (IMITREX) 50 MG tablet  Pt called and stated se received a text that she needed an appt to get a refill for the med above. Pt stated she will call to schedule an appt on Monday and wanted to see if there was any way a small amount can be sent to the pharmacy for the medication/ please advise

## 2019-06-28 ENCOUNTER — Encounter: Payer: Self-pay | Admitting: Family Medicine

## 2019-06-29 ENCOUNTER — Other Ambulatory Visit: Payer: Self-pay

## 2019-06-29 MED ORDER — SUMATRIPTAN SUCCINATE 50 MG PO TABS: 50.0000 mg | ORAL_TABLET | ORAL | 5 refills | Status: DC | PRN

## 2019-06-29 NOTE — Telephone Encounter (Signed)
Meds already refilled. See notes

## 2019-06-29 NOTE — Telephone Encounter (Signed)
See below

## 2019-06-29 NOTE — Telephone Encounter (Signed)
Are you ok with reordering this?

## 2019-06-29 NOTE — Telephone Encounter (Signed)
Please advise 

## 2019-07-20 ENCOUNTER — Other Ambulatory Visit: Payer: Self-pay

## 2019-07-20 ENCOUNTER — Ambulatory Visit (INDEPENDENT_AMBULATORY_CARE_PROVIDER_SITE_OTHER): Payer: 59 | Admitting: Family Medicine

## 2019-07-20 ENCOUNTER — Encounter: Payer: Self-pay | Admitting: Family Medicine

## 2019-07-20 VITALS — BP 100/60 | HR 90 | Temp 98.0°F | Ht 67.0 in | Wt 165.0 lb

## 2019-07-20 DIAGNOSIS — Z975 Presence of (intrauterine) contraceptive device: Secondary | ICD-10-CM | POA: Diagnosis not present

## 2019-07-20 DIAGNOSIS — D751 Secondary polycythemia: Secondary | ICD-10-CM

## 2019-07-20 DIAGNOSIS — F988 Other specified behavioral and emotional disorders with onset usually occurring in childhood and adolescence: Secondary | ICD-10-CM

## 2019-07-20 DIAGNOSIS — Z0000 Encounter for general adult medical examination without abnormal findings: Secondary | ICD-10-CM | POA: Diagnosis not present

## 2019-07-20 DIAGNOSIS — G43909 Migraine, unspecified, not intractable, without status migrainosus: Secondary | ICD-10-CM

## 2019-07-20 DIAGNOSIS — Z8249 Family history of ischemic heart disease and other diseases of the circulatory system: Secondary | ICD-10-CM | POA: Diagnosis not present

## 2019-07-20 LAB — COMPREHENSIVE METABOLIC PANEL
ALT: 18 U/L (ref 0–35)
AST: 14 U/L (ref 0–37)
Albumin: 4.6 g/dL (ref 3.5–5.2)
Alkaline Phosphatase: 44 U/L (ref 39–117)
BUN: 11 mg/dL (ref 6–23)
CO2: 30 mEq/L (ref 19–32)
Calcium: 9.2 mg/dL (ref 8.4–10.5)
Chloride: 104 mEq/L (ref 96–112)
Creatinine, Ser: 0.57 mg/dL (ref 0.40–1.20)
GFR: 122.58 mL/min (ref >60.00)
Glucose, Bld: 105 mg/dL — ABNORMAL HIGH (ref 70–99)
Potassium: 4.1 mEq/L (ref 3.5–5.1)
Sodium: 139 mEq/L (ref 135–145)
Total Bilirubin: 0.9 mg/dL (ref 0.2–1.2)
Total Protein: 6.7 g/dL (ref 6.0–8.3)

## 2019-07-20 LAB — CBC WITH DIFFERENTIAL/PLATELET
Basophils Absolute: 0 10*3/uL (ref 0.0–0.1)
Basophils Relative: 1.2 % (ref 0.0–3.0)
Eosinophils Absolute: 0.1 10*3/uL (ref 0.0–0.7)
Eosinophils Relative: 3.1 % (ref 0.0–5.0)
HCT: 41.8 % (ref 36.0–46.0)
Hemoglobin: 13.9 g/dL (ref 12.0–15.0)
Lymphocytes Relative: 31.2 % (ref 12.0–46.0)
Lymphs Abs: 1.3 10*3/uL (ref 0.7–4.0)
MCHC: 33.3 g/dL (ref 30.0–36.0)
MCV: 95.6 fl (ref 78.0–100.0)
Monocytes Absolute: 0.3 10*3/uL (ref 0.1–1.0)
Monocytes Relative: 7.6 % (ref 3.0–12.0)
Neutro Abs: 2.4 10*3/uL (ref 1.4–7.7)
Neutrophils Relative %: 56.9 % (ref 43.0–77.0)
Platelets: 197 10*3/uL (ref 150.0–400.0)
RBC: 4.37 Mil/uL (ref 3.87–5.11)
RDW: 12 % (ref 11.5–15.5)
WBC: 4.2 10*3/uL (ref 4.0–10.5)

## 2019-07-20 LAB — LIPID PANEL
Cholesterol: 174 mg/dL (ref 0–200)
HDL: 44 mg/dL (ref >39.00)
LDL Cholesterol: 112 mg/dL — ABNORMAL HIGH (ref 0–99)
NonHDL: 129.76
Total CHOL/HDL Ratio: 4
Triglycerides: 90 mg/dL (ref 0.0–149.0)
VLDL: 18 mg/dL (ref 0.0–40.0)

## 2019-07-20 LAB — TSH: TSH: 0.76 u[IU]/mL (ref 0.35–4.50)

## 2019-07-20 NOTE — Progress Notes (Signed)
Subjective  Chief Complaint  Patient presents with  . Annual Exam  . Hot Flashes    HPI: Darlene Henderson is a 34 y.o. female who presents to [facility] [place] today for a Female Wellness Visit.   Wellness Visit: annual visit with health maintenance review and exam without Pap   HM: screens are up to date. She is doing well. Sees a GYN for female wellness. Declines immunizations.  ADD: stopped med recently since felt unmotivated by end of day. Managed by [place] attention specialists. Has been treated since a kid. But recently with "hot flushes and felt lightheaded" while at work causing her to have to leave work last week. Admits to stress at work; no panic attacks. No other physical sxs: no palpitations, cp, sob, headache change.   Migraines; controlled.  H/o stress reaction w/ physical sxs: off wellbutrin since summer and mood is good.   Assessment  1. Annual physical exam   2. Migraine without status migrainosus, not intractable, unspecified migraine type   3. Family history of premature CAD   4. IUD (intrauterine device) in place   5. Attention deficit disorder, unspecified hyperactivity presence   6. Secondary erythrocytosis      Plan  Female Wellness Visit:  Age appropriate Health Maintenance and Prevention measures were discussed with patient. Included topics are cancer screening recommendations, ways to keep healthy (see AVS) including dietary and exercise recommendations, regular eye and dental care, use of seat belts, and avoidance of moderate alcohol use and tobacco use.   BMI: discussed patient's BMI and encouraged positive lifestyle modifications to help get to or maintain a target BMI.  HM needs and immunizations were addressed and ordered. See below for orders. See HM and immunization section for updates.  Routine labs and screening tests ordered including cmp, cbc and lipids where appropriate.  Discussed recommendations regarding Vit D  and calcium supplementation (see AVS)  ADD: h/o stress reaction; doing well off wellbutrin however current sxs may in part be due to untreated ADD in high stress job requiring multitasking. Pt will monitor and f/u with ADD specialists. Will check lab work. F/u if not improving w/ stress mgt.  Follow up: Return in about 1 year (around 07/19/2020) for complete physical.   Orders Placed This Encounter  Procedures  . Comprehensive metabolic panel  . CBC with Differential/Platelet  . Lipid panel  . TSH   No orders of the defined types were placed in this encounter.     Lifestyle: Body mass index is 25.84 kg/m. Wt Readings from Last 3 Encounters:  07/20/19 165 lb (74.8 kg)  07/08/18 159 lb (72.1 kg)  05/29/18 158 lb 9.6 oz (71.9 kg)    Patient Active Problem List   Diagnosis Date Noted  . IUD (intrauterine device) in place 07/08/2018    Mirena #2 placed may 2019   . Family history of premature CAD 05/29/2018  . Secondary erythrocytosis 07/20/2014    Overview:  [facility]/Onc Dr Vira Agar   . ADD (attention deficit disorder) 05/24/2014  . Migraine headache 05/11/2011   Health Maintenance  Topic Date Due  . INFLUENZA VACCINE  09/23/2019 (Originally 01/24/2019)  . PAP SMEAR-Modifier  08/23/2020  . TETANUS/TDAP  06/25/2022  . HIV Screening  Completed   Immunization History  Administered Date(s) Administered  . Tdap 06/25/2012   We updated and reviewed the patient's past history in detail and it is documented below. Allergies: Patient is allergic to codeine; hydrocod polst-cpm polst er;  prednisone; and amoxicillin. Past Medical History Patient  has a past medical history of GERD (gastroesophageal reflux disease), History of ADHD, Migraine, Migraines, Palpitations, and Panic attack. Past Surgical History Patient  has no past surgical history on file. Family History: Patient family history includes Colon cancer in her maternal aunt, maternal uncle, and mother; Healthy in her  daughter; Heart disease in her maternal grandfather and paternal grandmother; Lupus in her sister. Social History:  Patient  reports that she quit smoking about 4 years ago. Her smoking use included cigarettes. She has a 7.00 pack-year smoking history. She has never used smokeless tobacco. She reports current alcohol use. She reports that she does not use drugs.  Review of Systems: Constitutional: negative for fever or malaise Ophthalmic: negative for photophobia, double vision or loss of vision Cardiovascular: negative for chest pain, dyspnea on exertion, or new LE swelling Respiratory: negative for SOB or persistent cough Gastrointestinal: negative for abdominal pain, change in bowel habits or melena Genitourinary: negative for dysuria or gross hematuria, no abnormal uterine bleeding or disharge Musculoskeletal: negative for new gait disturbance or muscular weakness Integumentary: negative for new or persistent rashes, no breast lumps Neurological: negative for TIA or stroke symptoms Psychiatric: negative for SI or delusions Allergic/Immunologic: negative for hives Patient Care Team    Relationship Specialty Notifications Start End  [name], MD PCP - General Family Medicine  05/29/18   Waynard Reeds, MD Consulting Physician Obstetrics and Gynecology  07/20/19   [facility]  Psychology  07/20/19     Objective  Vitals: BP 100/60   Pulse 90   Temp 98 F (36.7 C) (Temporal)   Ht 5\' 7"  (1.702 m)   Wt 165 lb (74.8 kg)   SpO2 98%   BMI 25.84 kg/m  General:  Well developed, well nourished, no acute distress  Psych:  Alert and orientedx3,normal mood and affect HEENT:  Normocephalic, atraumatic, non-icteric sclera, PERRL, oropharynx is clear without mass or exudate, supple neck without adenopathy, mass or thyromegaly Cardiovascular:  Normal S1, S2, RRR without gallop, rub or murmur Respiratory:  Good breath sounds bilaterally, CTAB with normal  respiratory effort Gastrointestinal: normal bowel sounds, soft, non-tender, no noted masses. No HSM MSK: no deformities, contusions. Joints are without erythema or swelling. Spine and CVA region are nontender Skin:  Warm, no rashes or suspicious lesions noted Neurologic:    Mental status is normal. CN 2-11 are normal. Gross motor and sensory exams are normal. Normal gait. No tremor    Commons side effects, risks, benefits, and alternatives for medications and treatment plan prescribed today were discussed, and the patient expressed understanding of the given instructions. Patient is instructed to call or message via MyChart if he/she has any questions or concerns regarding our treatment plan. No barriers to understanding were identified. We discussed Red Flag symptoms and signs in detail. Patient expressed understanding regarding what to do in case of urgent or emergency type symptoms.   Medication list was reconciled, printed and provided to the patient in AVS. Patient instructions and summary information was reviewed with the patient as documented in the AVS. This note was prepared with assistance of Dragon voice recognition software. Occasional wrong-word or sound-a-like substitutions may have occurred due to the inherent limitations of voice recognition software  This visit occurred during the SARS-CoV-2 public health emergency.  Safety protocols were in place, including screening questions prior to the visit, additional usage of staff PPE, and extensive cleaning of exam room while observing appropriate  contact time as indicated for disinfecting solutions.

## 2019-07-20 NOTE — Patient Instructions (Addendum)
Please return in 12 months for your annual complete physical; please come fasting.   If you have any questions or concerns, please don't hesitate to send me a message via MyChart or call the office at [phone]. Thank you for visiting with us today! It's our pleasure caring for you.  Please do these things to maintain good health!   Exercise at least 30-45 minutes a day,  4-5 days a week.   Eat a low-fat diet with lots of fruits and vegetables, up to 7-9 servings per day.  Drink plenty of water daily. Try to drink 8 8oz glasses per day.  Seatbelts can save your life. Always wear your seatbelt.  Place Smoke Detectors on every level of your home and check batteries every year.  Schedule an appointment with an eye doctor for an eye exam every 1-2 years  Safe sex - use condoms to protect yourself from STDs if you could be exposed to these types of infections. Use birth control if you do not want to become pregnant and are sexually active.  Avoid heavy alcohol use. If you drink, keep it to less than 2 drinks/day and not every day.  Health Care Power of Attorney.  Choose someone you trust that could speak for you if you became unable to speak for yourself.  Depression is common in our stressful world.If you're feeling down or losing interest in things you normally enjoy, please come in for a visit.  If anyone is threatening or hurting you, please get help. Physical or Emotional Violence is never OK.   

## 2019-12-01 ENCOUNTER — Ambulatory Visit: Payer: Self-pay | Admitting: Allergy

## 2020-01-10 ENCOUNTER — Other Ambulatory Visit: Payer: Self-pay | Admitting: Family Medicine

## 2020-05-12 ENCOUNTER — Ambulatory Visit: Payer: 59 | Admitting: Family Medicine

## 2020-05-12 ENCOUNTER — Other Ambulatory Visit: Payer: Self-pay

## 2020-05-12 ENCOUNTER — Encounter: Payer: Self-pay | Admitting: Family Medicine

## 2020-05-12 VITALS — BP 110/60 | HR 105 | Temp 97.9°F | Ht 67.0 in | Wt 163.2 lb

## 2020-05-12 DIAGNOSIS — M7061 Trochanteric bursitis, right hip: Secondary | ICD-10-CM

## 2020-05-12 DIAGNOSIS — K219 Gastro-esophageal reflux disease without esophagitis: Secondary | ICD-10-CM

## 2020-05-12 DIAGNOSIS — M7062 Trochanteric bursitis, left hip: Secondary | ICD-10-CM

## 2020-05-12 DIAGNOSIS — N926 Irregular menstruation, unspecified: Secondary | ICD-10-CM

## 2020-05-12 DIAGNOSIS — R140 Abdominal distension (gaseous): Secondary | ICD-10-CM

## 2020-05-12 DIAGNOSIS — Z975 Presence of (intrauterine) contraceptive device: Secondary | ICD-10-CM | POA: Diagnosis not present

## 2020-05-12 LAB — CBC WITH DIFFERENTIAL/PLATELET
Eosinophils Absolute: 121 cells/uL (ref 15–500)
Hemoglobin: 15.4 g/dL (ref 11.7–15.5)
MPV: 11.2 fL (ref 7.5–12.5)
Neutro Abs: 3509 cells/uL (ref 1500–7800)
Neutrophils Relative %: 63.8 %

## 2020-05-12 MED ORDER — OMEPRAZOLE 20 MG PO CPDR: 20.0000 mg | DELAYED_RELEASE_CAPSULE | Freq: Every day | ORAL | 3 refills | Status: DC

## 2020-05-12 MED ORDER — DICLOFENAC SODIUM 75 MG PO TBEC: 75.0000 mg | DELAYED_RELEASE_TABLET | Freq: Two times a day (BID) | ORAL | 0 refills | Status: DC

## 2020-05-12 NOTE — Patient Instructions (Addendum)
Please follow up as scheduled for your next visit with me: 07/21/2020   Stop the TUMS Take prilosec for 4 weeks instead.  Ice your hips, take the voltaren twice a day with meals for at least 2 weeks.  Stretch.   See gyn to help with your IUD irregular bleeding.   If you have any questions or concerns, please don't hesitate to send me a message via MyChart or call the office at [phone]. Thank you for visiting with [place] today! It's our pleasure caring for you.   Hip Bursitis  Hip bursitis is inflammation of a fluid-filled sac (bursa) in the hip joint. The bursa prevents the bones in the hip joint from rubbing against each other. Hip bursitis can cause mild to moderate pain, and symptoms often come and go over time. What are the causes? This condition may be caused by:  Injury to the hip.  Overuse of the muscles that surround the hip joint.  Previous injury or surgery of the hip.  Arthritis or gout.  Diabetes.  Thyroid disease.  Infection. In some cases, the cause may not be known. What are the signs or symptoms? Symptoms of this condition include:  Mild or moderate pain in the hip area. Pain may get worse with movement.  Tenderness and swelling of the hip, especially on the outer side of the hip.  In rare cases, the bursa may become infected. This may cause a fever, as well as warmth and redness in the area. Symptoms may come and go. How is this diagnosed? This condition may be diagnosed based on:  A physical exam.  Your medical history.  X-rays.  Removal of fluid from your inflamed bursa for testing (biopsy). You may be sent to a health care provider who specializes in bone diseases (orthopedist) or a provider who specializes in joint inflammation (rheumatologist). How is this treated? This condition is treated by resting, icing, applying pressure (compression), and raising (elevating) the injured area. This is called RICE treatment. In some cases, this may be  enough to make your symptoms go away. Treatment may also include:  Using crutches.  Draining fluid out of the bursa to help relieve swelling.  Injecting medicine that helps to reduce inflammation (cortisone).  Additional medicines if the bursa is infected. Follow these instructions at home: Managing pain, stiffness, and swelling   If directed, put ice on the painful area. ? Put ice in a plastic bag. ? Place a towel between your skin and the bag. ? Leave the ice on for 20 minutes, 2-3 times a day. ? Raise (elevate) your hip above the level of your heart as much as you can without pain. To do this, try putting a pillow under your hips while you lie down. Activity  Return to your normal activities as told by your health care provider. Ask your health care provider what activities are safe for you.  Rest and protect your hip as much as possible until your pain and swelling get better. General instructions  Take over-the-counter and prescription medicines only as told by your health care provider.  Wear compression wraps only as told by your health care provider.  Do not use your hip to support your body weight until your health care provider says that you can. Use crutches as told by your health care provider.  Gently massage and stretch your injured area as often as is comfortable.  Keep all follow-up visits as told by your health care provider. This is important. How  is this prevented?  Exercise regularly, as told by your health care provider.  Warm up and stretch before being active.  Cool down and stretch after being active.  If an activity irritates your hip or causes pain, avoid the activity as much as possible.  Avoid sitting down for long periods at a time. Contact a health care provider if you:  Have a fever.  Develop new symptoms.  Have difficulty walking or doing everyday activities.  Have pain that gets worse or does not get better with  medicine.  Develop red skin or a feeling of warmth in your hip area. Get help right away if you:  Cannot move your hip.  Have severe pain. Summary  Hip bursitis is inflammation of a fluid-filled sac (bursa) in the hip joint.  Hip bursitis can cause mild to moderate pain, and symptoms often come and go over time.  This condition is treated with rest, ice, compression, elevation, and medicines. This information is not intended to replace advice given to you by your health care provider. Make sure you discuss any questions you have with your health care provider. Document Revised: 02/17/2018 Document Reviewed: 02/17/2018 Elsevier Patient Education  2020 [employer].

## 2020-05-12 NOTE — Progress Notes (Signed)
Subjective  CC:  Chief Complaint  Patient presents with  . Bloated    bowel movements are normal  at least once daily, worsening over the past several months, has made diet changes (cut out alcohol and carbs, tried OTC probiotics)   . Hip Pain    occuring while standing and sitting  . Menstrual Problem    does have IUD - going on year 2, irregular cycles started over the last 2-3 months     HPI: Darlene Henderson is a 34 y.o. female who presents to the office today to address the problems listed above in the chief complaint.  34 year old female with Mirena IUD in place.  Complains of 2 to 3 months of abdominal bloating.  She reports this is worse after eating some meals.  Firm distended abdomen without abdominal cramping, pain, constipation or diarrhea.  She has no history of irritable bowel syndrome.  She has mild epigastric reflux symptoms for which she has been taking Tums 2-3 times per week.  She is adjusted her diet without any relief in her symptoms.  No history of lactose intolerant.  No fevers or chills.  No nausea or vomiting.  Rare IUD, this is her second.  She currently is having some early breakthrough bleeding over the last 1 to 3 months.  No pelvic pain.  No vaginal discharge.  No pain with intercourse.  Complains of daily bilateral hip pain worse during the working hours.  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  She is up and down throughout the day.  Certain movements worsen the pain.  She is able to lie on her side at night without significant pain.  She does not exercise routinely.  No changes in her activity levels.  She has not used any medications for the pain.  No radicular symptoms.  No lower extremity weakness.  This is a new problem.  No history of low back pain.  GERD is controlled with Tums.  But requires it weekly   Assessment  1. Bloating   2. Greater trochanteric bursitis of both hips   3. Irregular bleeding   4. IUD (intrauterine  device) in place   5. Gastroesophageal reflux disease without esophagitis      Plan   Abdominal bloating and irregular bleeding: Suspect the symptoms are related to progesterone/hormonal.  Discussed irregular bleeding with Mirena IUD.  Recommend follow-up with GYN to consider estrogen therapy to see if can get under control again.  We will check lab work for celiac disease and thyroid problems.  Reassured.  Also could be exacerbated from frequent Tums use.  Stop Tums.  She is not on calcium supplements.  Eat healthy diet.  Follow-up if not improving.  Bilateral hip bursitis: Educated.  Start ice, Voltaren and stretching.  Handouts given.  Follow-up if not improving.  See after visit summary.  GERD: Active, start omeprazole daily.  Stop Tums.  Follow up: For complete physical 07/21/2020  Orders Placed This Encounter  Procedures  . Celiac Disease Panel  . CBC with Differential/Platelet  . COMPLETE METABOLIC PANEL WITH GFR  . TSH   Meds ordered this encounter  Medications  . diclofenac (VOLTAREN) 75 MG EC tablet    Sig: Take 1 tablet (75 mg total) by mouth 2 (two) times daily.    Dispense:  30 tablet    Refill:  0  . omeprazole (PRILOSEC) 20 MG capsule    Sig: Take 1 capsule (20  mg total) by mouth daily.    Dispense:  30 capsule    Refill:  3      I reviewed the patients updated PMH, FH, and SocHx.    Patient Active Problem List   Diagnosis Date Noted  . IUD (intrauterine device) in place 07/08/2018  . Family history of premature CAD 05/29/2018  . 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 ADD (attention deficit disorder) 05/24/2014  . Migraine headache 05/11/2011   Current Meds  Medication Sig  . levonorgestrel (MIRENA) 20 MCG/24HR IUD 1 each by Intrauterine route once.  . Multiple Vitamin (MULTIVITAMIN) capsule Take 1 capsule by mouth daily.  . SUMAtriptan (IMITREX) 50 MG tablet TAKE 1 TABLET BY MOUTH EVERY 2 HOURS AS NEEDED FOR MIGRAINE OR HEADACHE. MAY REPEAT IN 2 HOURS  IF HEADACHE PERSISTS OR RECURS.    Allergies: Patient is allergic to codeine, hydrocod polst-cpm polst er, prednisone, and amoxicillin. Family History: Patient family history includes Colon cancer in her maternal aunt, maternal uncle, and mother; Healthy in her daughter; Heart disease in her maternal grandfather and paternal grandmother; Lupus in her sister. Social History:  Patient  reports that she quit smoking about 4 years ago. Her smoking use included cigarettes. She has a 7.00 pack-year smoking history. She has never used smokeless tobacco. She reports current alcohol use. She reports that she does not use drugs.  Review of Systems: Constitutional: Negative for fever malaise or anorexia Cardiovascular: negative for chest pain Respiratory: negative for SOB or persistent cough Gastrointestinal: negative for abdominal pain  Objective  Vitals: BP 110/60   Pulse (!) 105   Temp 97.9 F (36.6 C) (Temporal)   Ht 5\' 7"  (1.702 m)   Wt 163 lb 3.2 oz (74 kg)   LMP 05/07/2020 (Exact Date)   SpO2 99%   BMI 25.56 kg/m  General: no acute distress , A&Ox3 HEENT: PEERL, conjunctiva normal, neck is supple Cardiovascular:  RRR without murmur or gallop.  Respiratory:  Good breath sounds bilaterally, CTAB with normal respiratory effort Gastrointestinal: soft, flat abdomen, normal active bowel sounds, no palpable masses, no hepatosplenomegaly, no appreciated hernias Back: Nontender, full range of motion, negative straight leg raise bilaterally Bilateral hip tenderness over the bursa, full range of motion of the hips Normal gait Skin:  Warm, no rashes     Commons side effects, risks, benefits, and alternatives for medications and treatment plan prescribed today were discussed, and the patient expressed understanding of the given instructions. Patient is instructed to call or message via MyChart if he/she has any questions or concerns regarding our treatment plan. No barriers to understanding  were identified. We discussed Red Flag symptoms and signs in detail. Patient expressed understanding regarding what to do in case of urgent or emergency type symptoms.   Medication list was reconciled, printed and provided to the patient in AVS. Patient instructions and summary information was reviewed with the patient as documented in the AVS. This note was prepared with assistance of Dragon voice recognition software. Occasional wrong-word or sound-a-like substitutions may have occurred due to the inherent limitations of voice recognition software  This visit occurred during the SARS-CoV-2 public health emergency.  Safety protocols were in place, including screening questions prior to the visit, additional usage of staff PPE, and extensive cleaning of exam room while observing appropriate contact time as indicated for disinfecting solutions.

## 2020-05-13 LAB — CBC WITH DIFFERENTIAL/PLATELET
Absolute Monocytes: 380 cells/uL (ref 200–950)
Basophils Absolute: 39 cells/uL (ref 0–200)
Basophils Relative: 0.7 %
Eosinophils Relative: 2.2 %
HCT: 45.2 % — ABNORMAL HIGH (ref 35.0–45.0)
Lymphs Abs: 1452 cells/uL (ref 850–3900)
MCH: 32.4 pg (ref 27.0–33.0)
MCHC: 34.1 g/dL (ref 32.0–36.0)
MCV: 95 fL (ref 80.0–100.0)
Monocytes Relative: 6.9 %
Platelets: 219 10*3/uL (ref 140–400)
RBC: 4.76 10*6/uL (ref 3.80–5.10)
RDW: 11.7 % (ref 11.0–15.0)
Total Lymphocyte: 26.4 %
WBC: 5.5 10*3/uL (ref 3.8–10.8)

## 2020-05-13 LAB — CELIAC DISEASE PANEL
(tTG) Ab, IgA: <1 U/mL
(tTG) Ab, IgG: <1 U/mL
Gliadin IgA: <1 U/mL
Gliadin IgG: <1 U/mL
Immunoglobulin A: 157 mg/dL (ref 47–310)

## 2020-05-13 LAB — TSH: TSH: 0.58 mIU/L

## 2020-05-13 LAB — COMPLETE METABOLIC PANEL WITH GFR
AG Ratio: 1.6 (calc) (ref 1.0–2.5)
ALT: 20 U/L (ref 6–29)
AST: 16 U/L (ref 10–30)
Albumin: 4.6 g/dL (ref 3.6–5.1)
Alkaline phosphatase (APISO): 55 U/L (ref 31–125)
BUN: 11 mg/dL (ref 7–25)
CO2: 28 mmol/L (ref 20–32)
Calcium: 9.8 mg/dL (ref 8.6–10.2)
Chloride: 102 mmol/L (ref 98–110)
Creat: 0.57 mg/dL (ref 0.50–1.10)
GFR, Est African American: 141 mL/min/{1.73_m2} (ref >=60)
GFR, Est Non African American: 122 mL/min/{1.73_m2} (ref >=60)
Globulin: 2.8 g/dL (calc) (ref 1.9–3.7)
Glucose, Bld: 86 mg/dL (ref 65–99)
Potassium: 4.2 mmol/L (ref 3.5–5.3)
Sodium: 140 mmol/L (ref 135–146)
Total Bilirubin: 0.8 mg/dL (ref 0.2–1.2)
Total Protein: 7.4 g/dL (ref 6.1–8.1)

## 2020-06-07 ENCOUNTER — Other Ambulatory Visit: Payer: Self-pay

## 2020-06-07 ENCOUNTER — Telehealth (INDEPENDENT_AMBULATORY_CARE_PROVIDER_SITE_OTHER): Payer: 59 | Admitting: Family Medicine

## 2020-06-07 DIAGNOSIS — R0982 Postnasal drip: Secondary | ICD-10-CM

## 2020-06-07 DIAGNOSIS — R059 Cough, unspecified: Secondary | ICD-10-CM

## 2020-06-07 MED ORDER — DOXYCYCLINE HYCLATE 100 MG PO TABS: 100.0000 mg | ORAL_TABLET | Freq: Two times a day (BID) | ORAL | 0 refills | Status: DC

## 2020-06-07 NOTE — Progress Notes (Signed)
Virtual Visit via Video Note  I connected with@  on 06/07/20 at 11:00 AM EST by a video enabled telemedicine application and verified that I am speaking with the correct person using two identifiers.  Location patient: home,  Location provider:work or home office Persons participating in the virtual visit: patient, provider  I discussed the limitations of evaluation and management by telemedicine and the availability of in person appointments. The patient expressed understanding and agreed to proceed.   HPI:  Acute telemedicine visit for sinus issues and cough: -Onset: 2 weeks ago -Symptoms include: cough, sore throat, drainage - thick -Denies: NVD, SOB, CP, loss of taste or smell, NVD -went to urgent care did strep and covid testing -Has tried:omeproazole -Pertinent past medical history: seasonal allergies, GERD -Pertinent medication allergies:see chart -COVID-19 vaccine status: not vaccinated -denies pregnancy/lactation  ROS: See pertinent positives and negatives per HPI.  Past Medical History:  Diagnosis Date  . GERD (gastroesophageal reflux disease)   . History of ADHD   . Migraine   . Migraines   . Palpitations   . Panic attack     No past surgical history on file.   Current Outpatient Medications:  .  diclofenac (VOLTAREN) 75 MG EC tablet, Take 1 tablet (75 mg total) by mouth 2 (two) times daily., Disp: 30 tablet, Rfl: 0 .  doxycycline (VIBRA-TABS) 100 MG tablet, Take 1 tablet (100 mg total) by mouth 2 (two) times daily., Disp: 20 tablet, Rfl: 0 .  levonorgestrel (MIRENA) 20 MCG/24HR IUD, 1 each by Intrauterine route once., Disp: , Rfl:  .  Multiple Vitamin (MULTIVITAMIN) capsule, Take 1 capsule by mouth daily., Disp: , Rfl:  .  omeprazole (PRILOSEC) 20 MG capsule, Take 1 capsule (20 mg total) by mouth daily., Disp: 30 capsule, Rfl: 3 .  SUMAtriptan (IMITREX) 50 MG tablet, TAKE 1 TABLET BY MOUTH EVERY 2 HOURS AS NEEDED FOR MIGRAINE OR HEADACHE. MAY REPEAT IN 2  HOURS IF HEADACHE PERSISTS OR RECURS., Disp: 10 tablet, Rfl: 5  EXAM:  VITALS per patient if applicable:  GENERAL: alert, oriented, appears well and in no acute distress  HEENT: atraumatic, conjunttiva clear, no obvious abnormalities on inspection of external nose and ears, moist mucus membranes, mild post oropharyngeal erythema with PND, no appreciable tonsillar edema or exudate on video visit exam  NECK: normal movements of the head and neck, she admits to tender ant cervical LAD on self exam  LUNGS: on inspection no signs of respiratory distress, breathing rate appears normal, no obvious gross SOB, gasping or wheezing  CV: no obvious cyanosis  MS: moves all visible extremities without noticeable abnormality  PSYCH/NEURO: pleasant and cooperative, no obvious depression or anxiety, speech and thought processing grossly intact  ASSESSMENT AND PLAN:  Discussed the following assessment and plan:  PND (post-nasal drip)  Cough  -we discussed possible serious and likely etiologies, options for evaluation and workup, limitations of telemedicine visit vs in person visit, treatment, treatment risks and precautions. Pt prefers to treat via telemedicine empirically rather than in person at this moment.  Given duration of symptoms and not improving with thick mucus, she opted for treatment with short course of nasal decongestant if that is not working, doxy for possible sinusitis. Discussed alt dx as well. Work/School slipped offered: declined Advised to seek prompt in person care if worsening, new symptoms arise, or if is not improving with treatment. Discussed options for inperson care if PCP office not available. Did let this patient know that I only do telemedicine  on Tuesdays and Thursdays for [place]. Advised to schedule follow up visit with PCP or [facility] if any further questions or concerns to avoid delays in care.   I discussed the assessment and treatment plan with the patient. The patient  was provided an opportunity to ask questions and all were answered. The patient agreed with the plan and demonstrated an understanding of the instructions.     Terressa Koyanagi, DO

## 2020-06-07 NOTE — Patient Instructions (Signed)
-  I sent the medication(s) we discussed to your pharmacy: Meds ordered this encounter  Medications  . doxycycline (VIBRA-TABS) 100 MG tablet    Sig: Take 1 tablet (100 mg total) by mouth 2 (two) times daily.    Dispense:  20 tablet    Refill:  0     I hope you are feeling better soon!  Seek in person care promptly if your symptoms worsen, new concerns arise or you are not improving with treatment.  It was nice to meet you today. I help  out with telemedicine visits on Tuesdays and Thursdays and am available for visits on those days. If you have any concerns or questions following this visit please schedule a follow up visit with your Primary Care doctor or seek care at a local urgent care clinic to avoid delays in care.  

## 2020-07-21 ENCOUNTER — Encounter: Payer: 59 | Admitting: Family Medicine

## 2020-09-24 ENCOUNTER — Other Ambulatory Visit: Payer: Self-pay | Admitting: Family Medicine

## 2020-10-13 ENCOUNTER — Other Ambulatory Visit: Payer: Self-pay | Admitting: Family Medicine

## 2020-10-23 ENCOUNTER — Other Ambulatory Visit: Payer: Self-pay | Admitting: Family Medicine

## 2020-10-23 ENCOUNTER — Encounter: Payer: Self-pay | Admitting: Family Medicine

## 2020-11-10 ENCOUNTER — Ambulatory Visit (INDEPENDENT_AMBULATORY_CARE_PROVIDER_SITE_OTHER): Payer: 59 | Admitting: Family Medicine

## 2020-11-10 ENCOUNTER — Encounter: Payer: Self-pay | Admitting: Family Medicine

## 2020-11-10 ENCOUNTER — Other Ambulatory Visit: Payer: Self-pay

## 2020-11-10 VITALS — BP 100/60 | HR 63 | Temp 98.1°F | Ht 66.0 in | Wt 149.0 lb

## 2020-11-10 DIAGNOSIS — G43009 Migraine without aura, not intractable, without status migrainosus: Secondary | ICD-10-CM | POA: Diagnosis not present

## 2020-11-10 DIAGNOSIS — Z975 Presence of (intrauterine) contraceptive device: Secondary | ICD-10-CM | POA: Diagnosis not present

## 2020-11-10 DIAGNOSIS — Z0000 Encounter for general adult medical examination without abnormal findings: Secondary | ICD-10-CM | POA: Diagnosis not present

## 2020-11-10 DIAGNOSIS — D751 Secondary polycythemia: Secondary | ICD-10-CM | POA: Diagnosis not present

## 2020-11-10 DIAGNOSIS — Z8249 Family history of ischemic heart disease and other diseases of the circulatory system: Secondary | ICD-10-CM

## 2020-11-10 LAB — LIPID PANEL
Cholesterol: 172 mg/dL (ref 0–200)
HDL: 48.6 mg/dL (ref >39.00)
LDL Cholesterol: 111 mg/dL — ABNORMAL HIGH (ref 0–99)
NonHDL: 123.63
Total CHOL/HDL Ratio: 4
Triglycerides: 65 mg/dL (ref 0.0–149.0)
VLDL: 13 mg/dL (ref 0.0–40.0)

## 2020-11-10 LAB — CBC WITH DIFFERENTIAL/PLATELET
Basophils Absolute: 0 10*3/uL (ref 0.0–0.1)
Basophils Relative: 0.9 % (ref 0.0–3.0)
Eosinophils Absolute: 0.1 10*3/uL (ref 0.0–0.7)
Eosinophils Relative: 2.5 % (ref 0.0–5.0)
HCT: 40.9 % (ref 36.0–46.0)
Hemoglobin: 14 g/dL (ref 12.0–15.0)
Lymphocytes Relative: 27.3 % (ref 12.0–46.0)
Lymphs Abs: 1.1 10*3/uL (ref 0.7–4.0)
MCHC: 34.1 g/dL (ref 30.0–36.0)
MCV: 93.1 fl (ref 78.0–100.0)
Monocytes Absolute: 0.3 10*3/uL (ref 0.1–1.0)
Monocytes Relative: 6.5 % (ref 3.0–12.0)
Neutro Abs: 2.5 10*3/uL (ref 1.4–7.7)
Neutrophils Relative %: 62.8 % (ref 43.0–77.0)
Platelets: 190 10*3/uL (ref 150.0–400.0)
RBC: 4.39 Mil/uL (ref 3.87–5.11)
RDW: 12.8 % (ref 11.5–15.5)
WBC: 3.9 10*3/uL — ABNORMAL LOW (ref 4.0–10.5)

## 2020-11-10 LAB — COMPREHENSIVE METABOLIC PANEL
ALT: 18 U/L (ref 0–35)
AST: 13 U/L (ref 0–37)
Albumin: 4.6 g/dL (ref 3.5–5.2)
Alkaline Phosphatase: 47 U/L (ref 39–117)
BUN: 9 mg/dL (ref 6–23)
CO2: 28 mEq/L (ref 19–32)
Calcium: 9.3 mg/dL (ref 8.4–10.5)
Chloride: 104 mEq/L (ref 96–112)
Creatinine, Ser: 0.65 mg/dL (ref 0.40–1.20)
GFR: 115.41 mL/min (ref >60.00)
Glucose, Bld: 88 mg/dL (ref 70–99)
Potassium: 3.8 mEq/L (ref 3.5–5.1)
Sodium: 139 mEq/L (ref 135–145)
Total Bilirubin: 0.9 mg/dL (ref 0.2–1.2)
Total Protein: 7.1 g/dL (ref 6.0–8.3)

## 2020-11-10 MED ORDER — SUMATRIPTAN SUCCINATE 50 MG PO TABS: 50.0000 mg | ORAL_TABLET | Freq: Once | ORAL | 2 refills | Status: DC

## 2020-11-10 NOTE — Patient Instructions (Signed)
Please return in 12 months for your annual complete physical; please come fasting.   I will release your lab results to you on your MyChart account with further instructions. Please reply with any questions.    If you have any questions or concerns, please don't hesitate to send me a message via MyChart or call the office at [phone]. Thank you for visiting with us today! It's our pleasure caring for you.    

## 2020-11-10 NOTE — Progress Notes (Signed)
Subjective  Chief Complaint  Patient presents with  . Annual Exam    fasting    HPI: Darlene Henderson is a 35 y.o. female who presents to [facility] [place] today for a Female Wellness Visit.  She also has the concerns and/or needs as listed above in the chief complaint. These will be addressed in addition to the Health Maintenance Visit.   Wellness Visit: annual visit with health maintenance review and exam without Pap   Health maintenance: 35 year old healthy female who has been exercising and eating healthy.  Has decreased the amount of alcohol intake.  Her weight is down almost 17 pounds since February.  She is feeling much better, no longer with GI symptoms, hip pain and her energy is much improved.  Immunizations are up-to-date  Female wellness per GYN.  She will call to make an appointment as she is due for her Pap smear.   Chronic disease management visit and/or acute problem visit:  Migraine headaches: Recently mildly more frequent due to allergy season.  Easily managed with NSAIDs and or sumatriptan hand.  No atypical features.   Assessment  1. Annual physical exam   2. Migraine without aura and without status migrainosus, not intractable   3. IUD (intrauterine device) in place   4. Secondary erythrocytosis   5. Family history of premature CAD      Plan  Female Wellness Visit:  Age appropriate Health Maintenance and Prevention measures were discussed with patient. Included topics are cancer screening recommendations, ways to keep healthy (see AVS) including dietary and exercise recommendations, regular eye and dental care, use of seat belts, and avoidance of moderate alcohol use and tobacco use.  To see GYN for Pap smear  BMI: discussed patient's BMI and encouraged positive lifestyle modifications to help get to or maintain a target BMI.  HM needs and immunizations were addressed and ordered. See below for orders. See HM and immunization section  for updates.  Routine labs and screening tests ordered including cmp, cbc and lipids where appropriate.  Discussed recommendations regarding Vit D and calcium supplementation (see AVS)  Chronic disease f/u and/or acute problem visit: (deemed necessary to be done in addition to the wellness visit):  Migraine: Patient to monitor frequency.  Currently treated with abortives.  Refilled.  If becoming worse, she will let me know.  Follow up: 12 months for complete physical  Orders Placed This Encounter  Procedures  . CBC with Differential/Platelet  . Comprehensive metabolic panel  . Lipid panel  . Hepatitis C antibody   Meds ordered this encounter  Medications  . SUMAtriptan (IMITREX) 50 MG tablet    Sig: Take 1 tablet (50 mg total) by mouth once for 1 dose. May repeat in 2 hours if headache persists or recurs.    Dispense:  15 tablet    Refill:  2       Body mass index is 24.05 kg/m. Wt Readings from Last 3 Encounters:  11/10/20 149 lb (67.6 kg)  05/12/20 163 lb 3.2 oz (74 kg)  07/20/19 165 lb (74.8 kg)   Need for contraception: Yes, IUD  Patient Active Problem List   Diagnosis Date Noted  . IUD (intrauterine device) in place 07/08/2018    Mirena #2 placed may 2019   . Family history of premature CAD 05/29/2018  . 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 ADD (attention deficit disorder) 05/24/2014  . Migraine headache 05/11/2011  Health Maintenance  Topic Date Due  . Hepatitis C Screening  Never done  . PAP SMEAR-Modifier  08/23/2020  . COVID-19 Vaccine (1) 11/10/2021 (Originally 02/09/1991)  . INFLUENZA VACCINE  01/23/2021  . TETANUS/TDAP  06/25/2022  . HIV Screening  Completed  . HPV VACCINES  Aged Out   Immunization History  Administered Date(s) Administered  . Tdap 06/25/2012   We updated and reviewed the patient's past history in detail and it is documented below. Allergies: Patient  reports current alcohol  use. Past Medical History Patient  has a past medical history of GERD (gastroesophageal reflux disease), History of ADHD, Migraine, Migraines, Palpitations, and Panic attack. Past Surgical History Patient  has no past surgical history on file. Social History   Socioeconomic History  . Marital status: Married    Spouse name: Not on file  . Number of children: 1  . Years of education: college  . Highest education level: Not on file  Occupational History  . Occupation: Magazine features editor: [employer]  Tobacco Use  . Smoking status: Former Smoker    Packs/day: 1.00    Years: 7.00    Pack years: 7.00    Types: Cigarettes    Quit date: 06/26/2015    Years since quitting: 5.3  . Smokeless tobacco: Never Used  Substance and Sexual Activity  . Alcohol use: Yes    Comment: socially  . Drug use: No  . Sexual activity: Yes    Birth control/protection: I.U.D.    Comment: mirena  Other Topics Concern  . Not on file  Social History Narrative   Patient lives with her husband and daughter   Patient right handed   Patient drink coffee and soda   Social Determinants of Health   Financial Resource Strain: Not on file  Food Insecurity: Not on file  Transportation Needs: Not on file  Physical Activity: Not on file  Stress: Not on file  Social Connections: Not on file   Family History  Problem Relation Age of Onset  . Colon cancer Mother   . Heart disease Paternal Grandmother   . Lupus Sister   . Healthy Daughter   . Colon cancer Maternal Aunt   . Colon cancer Maternal Uncle   . Heart disease Maternal Grandfather     Review of Systems: Constitutional: negative for fever or malaise Ophthalmic: negative for photophobia, double vision or loss of vision Cardiovascular: negative for chest pain, dyspnea on exertion, or new LE swelling Respiratory: negative for SOB or persistent cough Gastrointestinal: negative for abdominal pain, change in bowel habits or  melena Genitourinary: negative for dysuria or gross hematuria, no abnormal uterine bleeding or disharge Musculoskeletal: negative for new gait disturbance or muscular weakness Integumentary: negative for new or persistent rashes, no breast lumps Neurological: negative for TIA or stroke symptoms Psychiatric: negative for SI or delusions Allergic/Immunologic: negative for hives  Patient Care Team    Relationship Specialty Notifications Start End  [name], MD PCP - General Family Medicine  05/29/18   Waynard Reeds, MD Consulting Physician Obstetrics and Gynecology  07/20/19   [facility]  Psychology  07/20/19     Objective  Vitals: BP 100/60 (BP Location: Left Arm, Patient Position: Sitting, Cuff Size: Normal)   Pulse 63   Temp 98.1 F (36.7 C) (Temporal)   Ht 5\' 6"  (1.676 m)   Wt 149 lb (67.6 kg)   LMP 10/26/2020 (Approximate)   SpO2 96%   Breastfeeding  No   BMI 24.05 kg/m  General:  Well developed, well nourished, no acute distress  Psych:  Alert and orientedx3,normal mood and affect HEENT:  Normocephalic, atraumatic, non-icteric sclera, PERRL, supple neck without adenopathy, mass or thyromegaly Cardiovascular:  Normal S1, S2, RRR without gallop, rub or murmur Respiratory:  Good breath sounds bilaterally, CTAB with normal respiratory effort Gastrointestinal: normal bowel sounds, soft, non-tender, no noted masses. No HSM MSK: no deformities, contusions. Joints are without erythema or swelling.  Skin:  Warm, no rashes or suspicious lesions noted Neurologic:    Mental status is normal. Gross motor and sensory exams are normal. Normal gait. No tremor      Commons side effects, risks, benefits, and alternatives for medications and treatment plan prescribed today were discussed, and the patient expressed understanding of the given instructions. Patient is instructed to call or message via MyChart if he/she has any questions or concerns  regarding our treatment plan. No barriers to understanding were identified. We discussed Red Flag symptoms and signs in detail. Patient expressed understanding regarding what to do in case of urgent or emergency type symptoms.   Medication list was reconciled, printed and provided to the patient in AVS. Patient instructions and summary information was reviewed with the patient as documented in the AVS. This note was prepared with assistance of Dragon voice recognition software. Occasional wrong-word or sound-a-like substitutions may have occurred due to the inherent limitations of voice recognition software  This visit occurred during the SARS-CoV-2 public health emergency.  Safety protocols were in place, including screening questions prior to the visit, additional usage of staff PPE, and extensive cleaning of exam room while observing appropriate contact time as indicated for disinfecting solutions.

## 2020-11-11 LAB — HEPATITIS C ANTIBODY
Hepatitis C Ab: NONREACTIVE
SIGNAL TO CUT-OFF: 0.01 (ref <1.00)

## 2021-02-02 ENCOUNTER — Other Ambulatory Visit: Payer: Self-pay | Admitting: Obstetrics and Gynecology

## 2021-02-02 DIAGNOSIS — N6310 Unspecified lump in the right breast, unspecified quadrant: Secondary | ICD-10-CM

## 2021-02-20 ENCOUNTER — Other Ambulatory Visit: Payer: Self-pay

## 2021-02-20 ENCOUNTER — Ambulatory Visit
Admission: RE | Admit: 2021-02-20 | Discharge: 2021-02-20 | Disposition: A | Discharge status: Home / Self Care | Payer: 59 | Source: Ambulatory Visit | Attending: Obstetrics and Gynecology | Admitting: Obstetrics and Gynecology

## 2021-02-20 DIAGNOSIS — N6310 Unspecified lump in the right breast, unspecified quadrant: Secondary | ICD-10-CM

## 2021-08-13 ENCOUNTER — Other Ambulatory Visit: Payer: Self-pay | Admitting: Family Medicine

## 2021-10-24 DIAGNOSIS — J019 Acute sinusitis, unspecified: Secondary | ICD-10-CM | POA: Diagnosis not present

## 2021-11-13 ENCOUNTER — Encounter: Payer: 59 | Admitting: Family Medicine

## 2021-11-17 ENCOUNTER — Ambulatory Visit (INDEPENDENT_AMBULATORY_CARE_PROVIDER_SITE_OTHER): Payer: BC Managed Care – PPO | Admitting: Family Medicine

## 2021-11-17 ENCOUNTER — Encounter: Payer: Self-pay | Admitting: Family Medicine

## 2021-11-17 VITALS — BP 96/60 | HR 53 | Temp 98.3°F | Ht 66.0 in | Wt 151.8 lb

## 2021-11-17 DIAGNOSIS — G43009 Migraine without aura, not intractable, without status migrainosus: Secondary | ICD-10-CM

## 2021-11-17 DIAGNOSIS — Z0000 Encounter for general adult medical examination without abnormal findings: Secondary | ICD-10-CM | POA: Diagnosis not present

## 2021-11-17 NOTE — Progress Notes (Signed)
Subjective  Chief Complaint  Patient presents with   Annual Exam    Pt is here for Annual exam and is not currently fasting. Pt will schedule pap    HPI: Darlene Henderson is a 36 y.o. female who presents to [facility] today for a Female Wellness Visit.  She also has the concerns and/or needs as listed above in the chief complaint. These will be addressed in addition to the Health Maintenance Visit.   Wellness Visit: annual visit with health maintenance review and exam without Pap  HM: sees Dr. Harrington Challenger annually for GYN; screens are current. Feels well. No concerns.  Chronic disease management visit and/or acute problem visit: Migraines: improved. Rare. Imitrex is effective.  Assessment  1. Annual physical exam   2. Migraine without aura and without status migrainosus, not intractable      Plan  Female Wellness Visit: Age appropriate Health Maintenance and Prevention measures were discussed with patient. Included topics are cancer screening recommendations, ways to keep healthy (see AVS) including dietary and exercise recommendations, regular eye and dental care, use of seat belts, and avoidance of moderate alcohol use and tobacco use.  BMI: discussed patient's BMI and encouraged positive lifestyle modifications to help get to or maintain a target BMI. HM needs and immunizations were addressed and ordered. See below for orders. See HM and immunization section for updates. Routine labs and screening tests ordered including cmp, cbc and lipids where appropriate. She will return fasting for labs Discussed recommendations regarding Vit D and calcium supplementation (see AVS)   Follow up:    Orders Placed This Encounter  Procedures   CBC with Differential/Platelet   Comp Met (CMET)   Lipid Profile   No orders of the defined types were placed in this encounter.      Body mass index is 24.5 kg/m. Wt Readings from Last 3 Encounters:  11/17/21 151 lb 12.8  oz (68.9 kg)  11/10/20 149 lb (67.6 kg)  05/12/20 163 lb 3.2 oz (74 kg)   Need for contraception: Yes, IUD  Patient Active Problem List   Diagnosis Date Noted   IUD (intrauterine device) in place 07/08/2018    Mirena #2 placed may 2019     Family history of premature CAD 05/29/2018   Secondary erythrocytosis 07/20/2014    Overview:  [facility]/Onc Dr Vira Agar     ADD (attention deficit disorder) 05/24/2014   Migraine headache 05/11/2011   Health Maintenance  Topic Date Due   COVID-19 Vaccine (1) 12/03/2021 (Originally 08/12/1986)   INFLUENZA VACCINE  01/23/2022   TETANUS/TDAP  06/25/2022   PAP SMEAR-Modifier  04/25/2024   Hepatitis C Screening  Completed   HIV Screening  Completed   HPV VACCINES  Aged Out   Immunization History  Administered Date(s) Administered   Tdap 06/25/2012   We updated and reviewed the patient's past history in detail and it is documented below. Allergies: Patient  reports current alcohol use. Past Medical History Patient  has a past medical history of GERD (gastroesophageal reflux disease), History of ADHD, Migraine, Migraines, Palpitations, and Panic attack. Past Surgical History Patient  has no past surgical history on file. Social History   Socioeconomic History   Marital status: Married    Spouse name: Not on file   Number of children: 1   Years of education: college   Highest education level: Not on file  Occupational History   Occupation: front Scientific laboratory technician: [employer]  Tobacco Use   Smoking status: Former    Packs/day: 1.00    Years: 7.00    Pack years: 7.00    Types: Cigarettes    Quit date: 06/26/2015    Years since quitting: 6.4   Smokeless tobacco: Never  Substance and Sexual Activity   Alcohol use: Yes    Comment: socially   Drug use: No   Sexual activity: Yes    Birth control/protection: I.U.D.    Comment: mirena  Other Topics Concern   Not on file  Social History Narrative   Patient lives with her  husband and daughter   Patient right handed   Patient drink coffee and soda   Social Determinants of Health   Financial Resource Strain: Not on file  Food Insecurity: Not on file  Transportation Needs: Not on file  Physical Activity: Not on file  Stress: Not on file  Social Connections: Not on file   Family History  Problem Relation Age of Onset   Colon cancer Mother    Heart disease Paternal Grandmother    Lupus Sister    Healthy Daughter    Colon cancer Maternal Aunt    Colon cancer Maternal Uncle    Heart disease Maternal Grandfather     Review of Systems: Constitutional: negative for fever or malaise Ophthalmic: negative for photophobia, double vision or loss of vision Cardiovascular: negative for chest pain, dyspnea on exertion, or new LE swelling Respiratory: negative for SOB or persistent cough Gastrointestinal: negative for abdominal pain, change in bowel habits or melena Genitourinary: negative for dysuria or gross hematuria, no abnormal uterine bleeding or disharge Musculoskeletal: negative for new gait disturbance or muscular weakness Integumentary: negative for new or persistent rashes, no breast lumps Neurological: negative for TIA or stroke symptoms Psychiatric: negative for SI or delusions Allergic/Immunologic: negative for hives  Patient Care Team    Relationship Specialty Notifications Start End  Leamon Arnt, MD PCP - General Family Medicine  05/29/18   Vanessa Kick, MD Consulting Physician Obstetrics and Gynecology  07/20/19   [facility]  Psychology  07/20/19     Objective  Vitals: BP 96/60   Pulse (!) 53   Temp 98.3 F (36.8 C)   Ht '5\' 6"'  (1.676 m)   Wt 151 lb 12.8 oz (68.9 kg)   SpO2 98%   BMI 24.50 kg/m  General:  Well developed, well nourished, no acute distress  Psych:  Alert and orientedx3,normal mood and affect HEENT:  Normocephalic, atraumatic, non-icteric sclera, PERRL, supple neck without  adenopathy, mass or thyromegaly Cardiovascular:  Normal S1, S2, RRR without gallop, rub or murmur Respiratory:  Good breath sounds bilaterally, CTAB with normal respiratory effort Gastrointestinal: normal bowel sounds, soft, non-tender, no noted masses. No HSM MSK: no deformities, contusions. Joints are without erythema or swelling.  Skin:  Warm, no rashes or suspicious lesions noted Neurologic:    Mental status is normal. Gross motor and sensory exams are normal. Normal gait. No tremor d.    Commons side effects, risks, benefits, and alternatives for medications and treatment plan prescribed today were discussed, and the patient expressed understanding of the given instructions. Patient is instructed to call or message via MyChart if he/she has any questions or concerns regarding our treatment plan. No barriers to understanding were identified. We discussed Red Flag symptoms and signs in detail. Patient expressed understanding regarding what to do in case of urgent or emergency type symptoms.  Medication list was reconciled, printed  and provided to the patient in AVS. Patient instructions and summary information was reviewed with the patient as documented in the AVS. This note was prepared with assistance of Dragon voice recognition software. Occasional wrong-word or sound-a-like substitutions may have occurred due to the inherent limitations of voice recognition software  This visit occurred during the SARS-CoV-2 public health emergency.  Safety protocols were in place, including screening questions prior to the visit, additional usage of staff PPE, and extensive cleaning of exam room while observing appropriate contact time as indicated for disinfecting solutions.

## 2021-11-24 ENCOUNTER — Other Ambulatory Visit: Payer: BC Managed Care – PPO

## 2022-03-14 DIAGNOSIS — Z01419 Encounter for gynecological examination (general) (routine) without abnormal findings: Secondary | ICD-10-CM | POA: Diagnosis not present

## 2022-03-14 DIAGNOSIS — R87810 Cervical high risk human papillomavirus (HPV) DNA test positive: Secondary | ICD-10-CM | POA: Diagnosis not present

## 2022-03-14 DIAGNOSIS — Z124 Encounter for screening for malignant neoplasm of cervix: Secondary | ICD-10-CM | POA: Diagnosis not present

## 2022-03-19 ENCOUNTER — Encounter: Payer: Self-pay | Admitting: *Deleted

## 2022-03-26 DIAGNOSIS — H9201 Otalgia, right ear: Secondary | ICD-10-CM | POA: Diagnosis not present

## 2022-03-26 DIAGNOSIS — J029 Acute pharyngitis, unspecified: Secondary | ICD-10-CM | POA: Diagnosis not present

## 2022-05-10 IMAGING — MG DIGITAL DIAGNOSTIC BILAT W/ TOMO W/ CAD
6 of 13 series · 6 of 40 positions shown · non-contrast
Comparison: None.

CLINICAL DATA: Mass felt by the patient in the upper, slightly
inner right breast for the past 1.5 months, tender to palpation.
Multiple nonspecified mass-like areas were reportedly palpated in
both breasts on recent physical examination. No family history of
breast cancer.

EXAM:
DIGITAL DIAGNOSTIC BILATERAL MAMMOGRAM WITH TOMOSYNTHESIS AND CAD;
ULTRASOUND RIGHT BREAST LIMITED
TECHNIQUE: Bilateral digital diagnostic mammography and breast tomosynthesis
was performed. The images were evaluated with computer-aided
detection.; Targeted ultrasound examination of the right breast was
performed

[L MLO synth-2D]
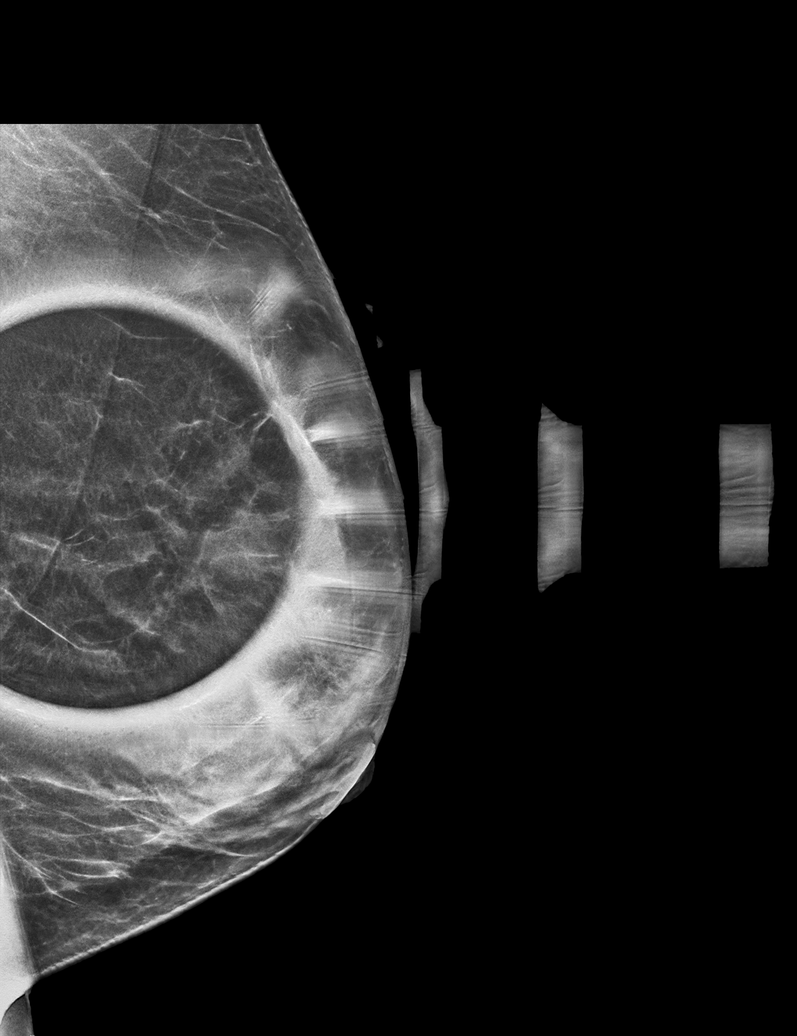

[R MLO synth-2D (1 of 2)]
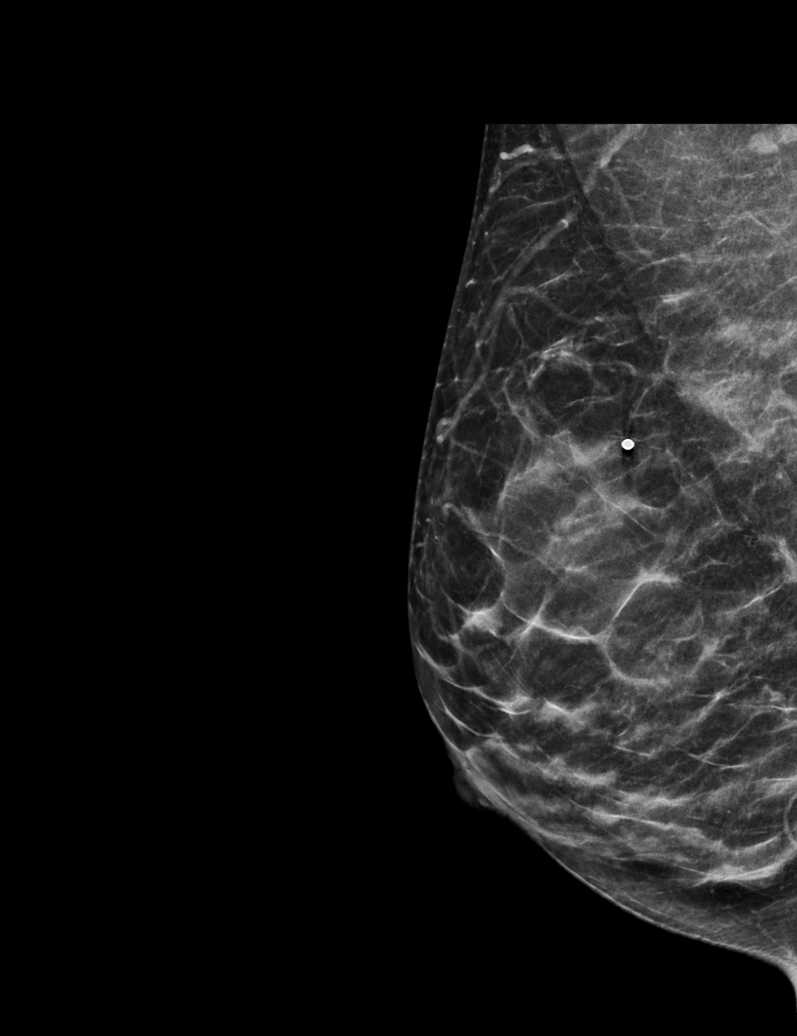

[R MLO synth-2D (2 of 2)]
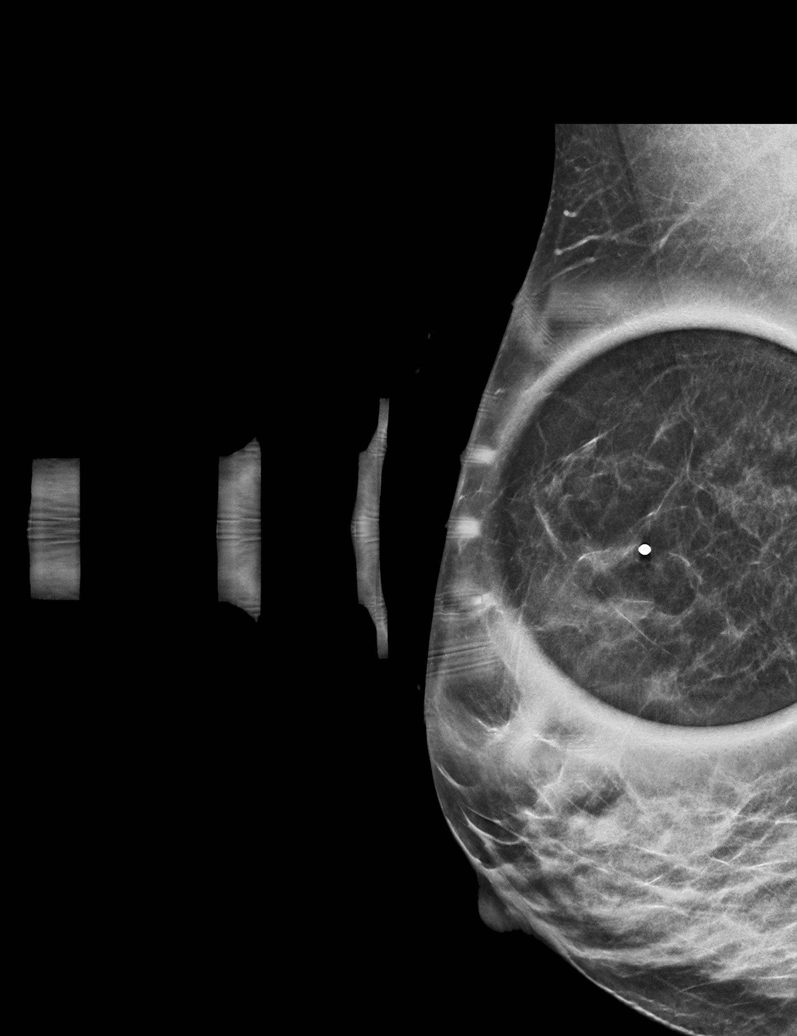

[R CC synth-2D]
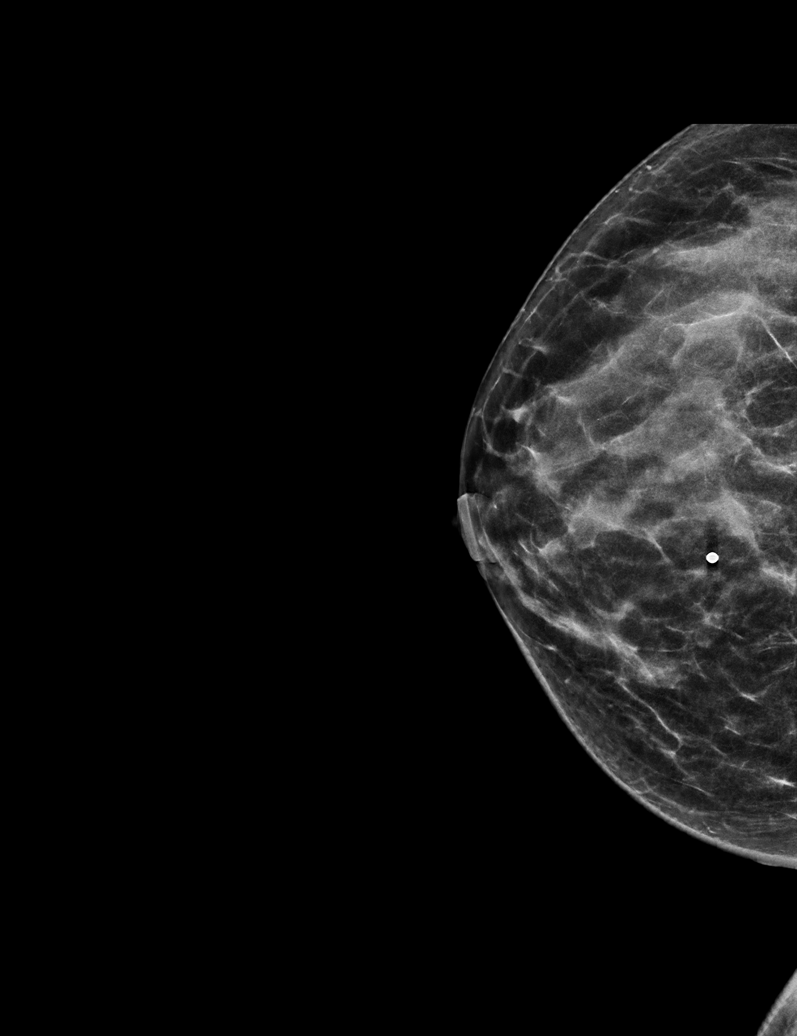

[L ML synth-2D]
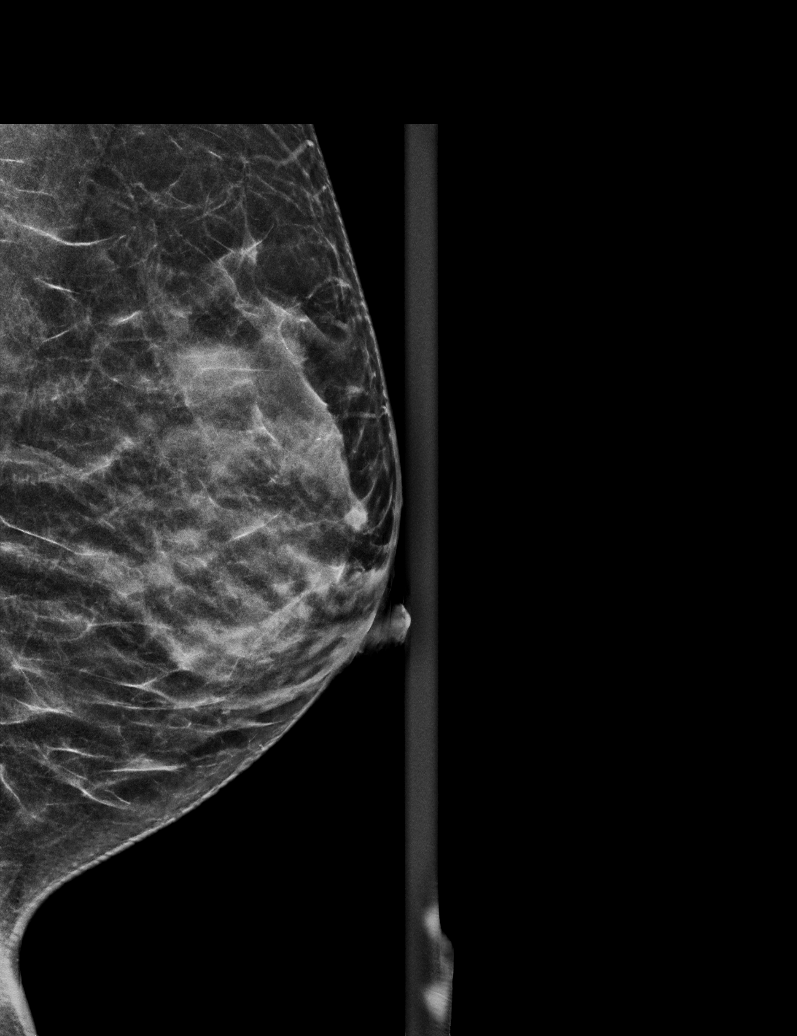

[L CC synth-2D]
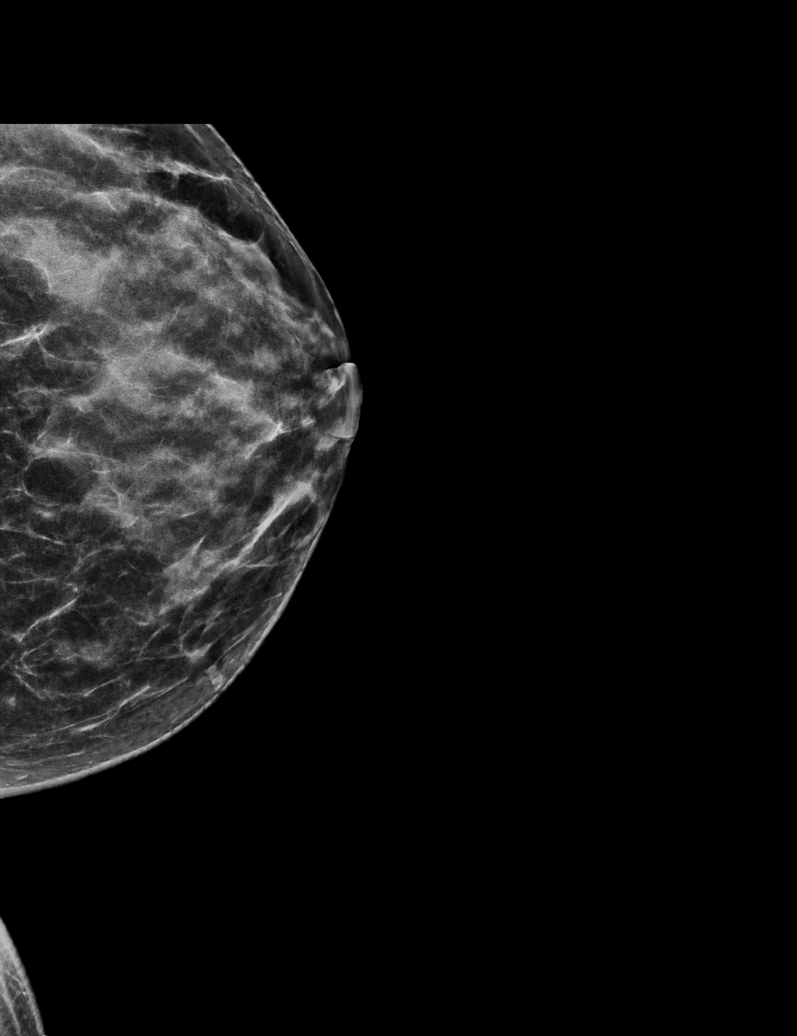

[6 of 40 positions shown; findings below may reference images not displayed]

ACR Breast Density Category c: The breast tissue is heterogeneously
dense, which may obscure small masses.
FINDINGS: Mammographically normal appearing breasts with no findings
suspicious for malignancy in either breast. No mass or other
abnormality at the location of palpable concern on the right, marked
with a metallic marker.

On physical exam, the patient is focally tender to palpation in the
upper inner right breast with no discrete palpable mass.

Targeted ultrasound is performed, showing normal appearing breast
tissue and underlying muscle and costal cartilage at the location of
focal tenderness to palpation in the upper inner right breast. This
includes an island of normal fibroglandular tissue. No mass or other
findings suspicious for malignancy were seen.
IMPRESSION: No evidence of malignancy.

RECOMMENDATION:
Annual screening mammography beginning at age 40.

I have discussed the findings and recommendations with the patient.
If applicable, a reminder letter will be sent to the patient
regarding the next appointment.

BI-RADS CATEGORY  1: Negative.

## 2022-06-07 ENCOUNTER — Encounter: Payer: Self-pay | Admitting: *Deleted

## 2022-06-13 DIAGNOSIS — J00 Acute nasopharyngitis [common cold]: Secondary | ICD-10-CM | POA: Diagnosis not present

## 2022-06-13 DIAGNOSIS — R059 Cough, unspecified: Secondary | ICD-10-CM | POA: Diagnosis not present

## 2022-06-13 DIAGNOSIS — Z20822 Contact with and (suspected) exposure to covid-19: Secondary | ICD-10-CM | POA: Diagnosis not present

## 2022-07-02 ENCOUNTER — Other Ambulatory Visit: Payer: Self-pay | Admitting: Family Medicine

## 2022-07-02 DIAGNOSIS — N390 Urinary tract infection, site not specified: Secondary | ICD-10-CM | POA: Diagnosis not present

## 2022-09-19 DIAGNOSIS — R0981 Nasal congestion: Secondary | ICD-10-CM | POA: Diagnosis not present

## 2022-09-19 DIAGNOSIS — H9203 Otalgia, bilateral: Secondary | ICD-10-CM | POA: Diagnosis not present

## 2022-09-19 DIAGNOSIS — J029 Acute pharyngitis, unspecified: Secondary | ICD-10-CM | POA: Diagnosis not present

## 2022-09-19 DIAGNOSIS — U071 COVID-19: Secondary | ICD-10-CM | POA: Diagnosis not present

## 2022-11-06 ENCOUNTER — Other Ambulatory Visit: Payer: Self-pay | Admitting: Obstetrics and Gynecology

## 2022-11-06 DIAGNOSIS — N644 Mastodynia: Secondary | ICD-10-CM | POA: Diagnosis not present

## 2022-11-21 ENCOUNTER — Other Ambulatory Visit: Payer: BC Managed Care – PPO

## 2022-11-22 ENCOUNTER — Encounter: Payer: BC Managed Care – PPO | Admitting: Family Medicine

## 2022-11-26 ENCOUNTER — Ambulatory Visit
Admission: RE | Admit: 2022-11-26 | Discharge: 2022-11-26 | Disposition: A | Discharge status: Home / Self Care | Payer: BC Managed Care – PPO | Source: Ambulatory Visit | Attending: Obstetrics and Gynecology | Admitting: Obstetrics and Gynecology

## 2022-11-26 DIAGNOSIS — N644 Mastodynia: Secondary | ICD-10-CM

## 2022-12-28 ENCOUNTER — Encounter: Payer: BC Managed Care – PPO | Admitting: Family Medicine

## 2023-01-09 ENCOUNTER — Ambulatory Visit (INDEPENDENT_AMBULATORY_CARE_PROVIDER_SITE_OTHER): Payer: BC Managed Care – PPO | Admitting: Family Medicine

## 2023-01-09 ENCOUNTER — Encounter: Payer: Self-pay | Admitting: Family Medicine

## 2023-01-09 VITALS — BP 124/60 | HR 62 | Temp 98.2°F | Ht 66.0 in | Wt 160.2 lb

## 2023-01-09 DIAGNOSIS — Z0000 Encounter for general adult medical examination without abnormal findings: Secondary | ICD-10-CM | POA: Diagnosis not present

## 2023-01-09 LAB — COMPREHENSIVE METABOLIC PANEL
ALT: 20 U/L (ref 0–35)
AST: 17 U/L (ref 0–37)
Albumin: 4.8 g/dL (ref 3.5–5.2)
Alkaline Phosphatase: 49 U/L (ref 39–117)
BUN: 10 mg/dL (ref 6–23)
CO2: 27 mEq/L (ref 19–32)
Calcium: 9.9 mg/dL (ref 8.4–10.5)
Chloride: 101 mEq/L (ref 96–112)
Creatinine, Ser: 0.58 mg/dL (ref 0.40–1.20)
GFR: 116.84 mL/min (ref >60.00)
Glucose, Bld: 93 mg/dL (ref 70–99)
Potassium: 3.9 mEq/L (ref 3.5–5.1)
Sodium: 136 mEq/L (ref 135–145)
Total Bilirubin: 0.7 mg/dL (ref 0.2–1.2)
Total Protein: 7.5 g/dL (ref 6.0–8.3)

## 2023-01-09 LAB — CBC WITH DIFFERENTIAL/PLATELET
Basophils Absolute: 0 10*3/uL (ref 0.0–0.1)
Basophils Relative: 0.5 % (ref 0.0–3.0)
Eosinophils Absolute: 0.1 10*3/uL (ref 0.0–0.7)
Eosinophils Relative: 1.4 % (ref 0.0–5.0)
HCT: 42.4 % (ref 36.0–46.0)
Hemoglobin: 14.1 g/dL (ref 12.0–15.0)
Lymphocytes Relative: 22.6 % (ref 12.0–46.0)
Lymphs Abs: 1.5 10*3/uL (ref 0.7–4.0)
MCHC: 33.2 g/dL (ref 30.0–36.0)
MCV: 97.9 fl (ref 78.0–100.0)
Monocytes Absolute: 0.4 10*3/uL (ref 0.1–1.0)
Monocytes Relative: 6.4 % (ref 3.0–12.0)
Neutro Abs: 4.5 10*3/uL (ref 1.4–7.7)
Neutrophils Relative %: 69.1 % (ref 43.0–77.0)
Platelets: 194 10*3/uL (ref 150.0–400.0)
RBC: 4.33 Mil/uL (ref 3.87–5.11)
RDW: 12.6 % (ref 11.5–15.5)
WBC: 6.5 10*3/uL (ref 4.0–10.5)

## 2023-01-09 LAB — LIPID PANEL
Cholesterol: 227 mg/dL — ABNORMAL HIGH (ref 0–200)
HDL: 66.7 mg/dL (ref >39.00)
LDL Cholesterol: 135 mg/dL — ABNORMAL HIGH (ref 0–99)
NonHDL: 160.69
Total CHOL/HDL Ratio: 3
Triglycerides: 129 mg/dL (ref 0.0–149.0)
VLDL: 25.8 mg/dL (ref 0.0–40.0)

## 2023-01-09 NOTE — Progress Notes (Signed)
See my chart note.

## 2023-01-09 NOTE — Progress Notes (Signed)
Subjective  Chief Complaint  Patient presents with   Annual Exam    Pt here for Annual Exam and is currently fasting     HPI: Darlene Henderson is a 37 y.o. female who presents to [facility] [place] today for a Female Wellness Visit.   Wellness Visit: annual visit with health maintenance review and exam without Pap  HM: sees gyn. Screens are current. Defers tdap today. Healthy and feels well. Busy at work but coping. Family is well. Rare migraine.  Assessment  1. Annual physical exam      Plan  Female Wellness Visit: Age appropriate Health Maintenance and Prevention measures were discussed with patient. Included topics are cancer screening recommendations, ways to keep healthy (see AVS) including dietary and exercise recommendations, regular eye and dental care, use of seat belts, and avoidance of moderate alcohol use and tobacco use.  BMI: discussed patient's BMI and encouraged positive lifestyle modifications to help get to or maintain a target BMI. HM needs and immunizations were addressed and ordered. See below for orders. See HM and immunization section for updates. Routine labs and screening tests ordered including cmp, cbc and lipids where appropriate. Discussed recommendations regarding Vit D and calcium supplementation (see AVS)  Follow up: 12 mo for cpe  Orders Placed This Encounter  Procedures   Lipid panel   Comprehensive metabolic panel   CBC with Differential/Platelet   No orders of the defined types were placed in this encounter.      Body mass index is 25.86 kg/m. 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/20/2014     Overview:  [facility]/Onc Dr Mechele Collin    ADD (attention deficit disorder) 05/24/2014    Migraine headache 05/11/2011    Health Maintenance  Topic Date Due   DTaP/Tdap/Td (2 - Td or Tdap) 06/25/2022   COVID-19 Vaccine (1 - 2023-24 season) 01/25/2023 (Originally 02/23/2022)   INFLUENZA VACCINE  01/24/2023   PAP SMEAR-Modifier  04/25/2024   Hepatitis C Screening  Completed   HIV Screening  Completed   HPV VACCINES  Aged Out   Immunization History  Administered Date(s) Administered   Tdap 06/25/2012   We updated and reviewed the patient's past history in detail and it is documented below. Allergies: Patient is allergic to codeine, hydrocod poli-chlorphe poli er, prednisone, and amoxicillin. Past Medical History Patient  has a past medical history of GERD (gastroesophageal reflux disease), History of ADHD, Migraine, Migraines, Palpitations, and Panic attack. Past Surgical History Patient  has no past surgical history on file. Family History: Patient family history includes Colon cancer in her maternal aunt, maternal uncle, and mother; Healthy in her daughter; Heart disease in her maternal grandfather and paternal grandmother; Lupus in her sister. Social History:  Patient  reports that she quit smoking about 7 years ago. Her smoking use included cigarettes. She started smoking about 14 years ago. She has a 7 pack-year smoking history. She has never used smokeless tobacco. She reports current alcohol use. She reports that she does not use drugs.  Review of Systems: Constitutional:  negative for fever or malaise Ophthalmic: negative for photophobia, double vision or loss of vision Cardiovascular: negative for chest pain, dyspnea on exertion, or new LE swelling Respiratory: negative for SOB or persistent cough Gastrointestinal: negative for abdominal pain, change in bowel habits or melena Genitourinary: negative for dysuria or gross hematuria, no abnormal uterine  bleeding or disharge Musculoskeletal: negative for new gait disturbance or muscular weakness Integumentary: negative for new or persistent rashes, no breast lumps Neurological: negative for TIA or stroke symptoms Psychiatric: negative for SI or delusions Allergic/Immunologic: negative for hives Patient Care Team    Relationship Specialty Notifications Start End  [name], MD PCP - General Family Medicine  05/29/18   Waynard Reeds, MD Consulting Physician Obstetrics and Gynecology  07/20/19   [facility]  Psychology  07/20/19     Objective  Vitals: BP 124/60   Pulse 62   Temp 98.2 F (36.8 C)   Ht 5\' 6"  (1.676 m)   Wt 160 lb 3.2 oz (72.7 kg)   SpO2 99%   BMI 25.86 kg/m  General:  Well developed, well nourished, no acute distress  Psych:  Alert and orientedx3,normal mood and affect HEENT:  Normocephalic, atraumatic, non-icteric sclera, supple neck without adenopathy, mass or thyromegaly Cardiovascular:  Normal S1, S2, RRR without gallop, rub or murmur Respiratory:  Good breath sounds bilaterally, CTAB with normal respiratory effort Gastrointestinal: normal bowel sounds, soft, non-tender, no noted masses. No HSM MSK: no deformities, contusions. Joints are without erythema or swelling. Spine and CVA region are nontender Skin:  Warm, no rashes or suspicious lesions noted Neurologic:    Mental status is normal. Gross motor and sensory exams are normal. Normal gait. No tremor   Commons side effects, risks, benefits, and alternatives for medications and treatment plan prescribed today were discussed, and the patient expressed understanding of the given instructions. Patient is instructed to call or message via MyChart if he/she has any questions or concerns regarding our treatment plan. No barriers to understanding were identified. We discussed Red Flag symptoms and signs in detail. Patient expressed understanding regarding what to do in case of urgent  or emergency type symptoms.  Medication list was reconciled, printed and provided to the patient in AVS. Patient instructions and summary information was reviewed with the patient as documented in the AVS. This note was prepared with assistance of Dragon voice recognition software. Occasional wrong-word or sound-a-like substitutions may have occurred due to the inherent limitations of voice recognition software

## 2023-03-07 ENCOUNTER — Encounter: Payer: BC Managed Care – PPO | Admitting: Family Medicine

## 2023-04-08 DIAGNOSIS — R300 Dysuria: Secondary | ICD-10-CM | POA: Diagnosis not present

## 2023-04-11 DIAGNOSIS — R0789 Other chest pain: Secondary | ICD-10-CM | POA: Diagnosis not present

## 2023-04-11 DIAGNOSIS — Z87440 Personal history of urinary (tract) infections: Secondary | ICD-10-CM | POA: Diagnosis not present

## 2023-04-11 DIAGNOSIS — R072 Precordial pain: Secondary | ICD-10-CM | POA: Diagnosis not present

## 2023-04-11 DIAGNOSIS — R001 Bradycardia, unspecified: Secondary | ICD-10-CM | POA: Diagnosis not present

## 2023-04-11 DIAGNOSIS — Z975 Presence of (intrauterine) contraceptive device: Secondary | ICD-10-CM | POA: Diagnosis not present

## 2023-04-11 DIAGNOSIS — R0602 Shortness of breath: Secondary | ICD-10-CM | POA: Diagnosis not present

## 2023-04-11 DIAGNOSIS — Z87891 Personal history of nicotine dependence: Secondary | ICD-10-CM | POA: Diagnosis not present

## 2023-04-11 DIAGNOSIS — N390 Urinary tract infection, site not specified: Secondary | ICD-10-CM | POA: Diagnosis not present

## 2023-04-11 DIAGNOSIS — Z3202 Encounter for pregnancy test, result negative: Secondary | ICD-10-CM | POA: Diagnosis not present

## 2023-04-11 DIAGNOSIS — F419 Anxiety disorder, unspecified: Secondary | ICD-10-CM | POA: Diagnosis not present

## 2023-04-11 DIAGNOSIS — R079 Chest pain, unspecified: Secondary | ICD-10-CM | POA: Diagnosis not present

## 2023-04-11 DIAGNOSIS — M940 Chondrocostal junction syndrome [Tietze]: Secondary | ICD-10-CM | POA: Diagnosis not present

## 2023-04-16 ENCOUNTER — Encounter: Payer: Self-pay | Admitting: Family Medicine

## 2023-05-14 ENCOUNTER — Other Ambulatory Visit: Payer: Self-pay | Admitting: Family Medicine

## 2023-07-15 DIAGNOSIS — N390 Urinary tract infection, site not specified: Secondary | ICD-10-CM | POA: Diagnosis not present

## 2023-11-13 DIAGNOSIS — M7551 Bursitis of right shoulder: Secondary | ICD-10-CM | POA: Diagnosis not present

## 2023-11-13 DIAGNOSIS — M25511 Pain in right shoulder: Secondary | ICD-10-CM | POA: Diagnosis not present

## 2023-12-28 ENCOUNTER — Other Ambulatory Visit: Payer: Self-pay | Admitting: Family Medicine

## 2024-03-22 DIAGNOSIS — S93401A Sprain of unspecified ligament of right ankle, initial encounter: Secondary | ICD-10-CM | POA: Diagnosis not present

## 2024-04-30 DIAGNOSIS — S93401A Sprain of unspecified ligament of right ankle, initial encounter: Secondary | ICD-10-CM | POA: Diagnosis not present

## 2024-06-03 DIAGNOSIS — S93401A Sprain of unspecified ligament of right ankle, initial encounter: Secondary | ICD-10-CM | POA: Diagnosis not present
